# Patient Record
Sex: Female | Born: 1965 | Race: White | Hispanic: No | Smoking: Never smoker
Health system: Southern US, Community
[De-identification: ages and names within clinical notes are randomized; demographics above are authoritative.]

## PROBLEM LIST (undated history)

## (undated) DIAGNOSIS — F32A Depression, unspecified: Secondary | ICD-10-CM

## (undated) DIAGNOSIS — F329 Major depressive disorder, single episode, unspecified: Secondary | ICD-10-CM

## (undated) HISTORY — PX: WISDOM TOOTH EXTRACTION: SHX21

## (undated) HISTORY — PX: DILATION AND CURETTAGE OF UTERUS: SHX78

## (undated) HISTORY — PX: KNEE ARTHROSCOPY: SHX127

---

## 1999-04-26 ENCOUNTER — Other Ambulatory Visit: Admission: RE | Admit: 1999-04-26 | Discharge: 1999-04-26 | Payer: Self-pay | Admitting: Family Medicine

## 1999-09-15 ENCOUNTER — Encounter (INDEPENDENT_AMBULATORY_CARE_PROVIDER_SITE_OTHER): Payer: Self-pay | Admitting: Specialist

## 1999-09-15 ENCOUNTER — Other Ambulatory Visit: Admission: RE | Admit: 1999-09-15 | Discharge: 1999-09-15 | Payer: Self-pay | Admitting: Gynecology

## 1999-12-12 ENCOUNTER — Other Ambulatory Visit: Admission: RE | Admit: 1999-12-12 | Discharge: 1999-12-12 | Payer: Self-pay | Admitting: Gynecology

## 2000-08-23 ENCOUNTER — Other Ambulatory Visit: Admission: RE | Admit: 2000-08-23 | Discharge: 2000-08-23 | Payer: Self-pay | Admitting: Obstetrics and Gynecology

## 2000-09-20 ENCOUNTER — Ambulatory Visit (HOSPITAL_COMMUNITY): Admission: RE | Admit: 2000-09-20 | Discharge: 2000-09-20 | Payer: Self-pay | Admitting: Obstetrics and Gynecology

## 2000-09-20 ENCOUNTER — Encounter: Payer: Self-pay | Admitting: Obstetrics and Gynecology

## 2000-09-27 ENCOUNTER — Ambulatory Visit (HOSPITAL_COMMUNITY): Admission: RE | Admit: 2000-09-27 | Discharge: 2000-09-27 | Payer: Self-pay | Admitting: Obstetrics and Gynecology

## 2000-09-27 ENCOUNTER — Inpatient Hospital Stay (HOSPITAL_COMMUNITY): Admission: AD | Admit: 2000-09-27 | Discharge: 2000-09-27 | Payer: Self-pay | Admitting: Obstetrics and Gynecology

## 2000-09-27 ENCOUNTER — Encounter: Payer: Self-pay | Admitting: Obstetrics and Gynecology

## 2000-10-18 ENCOUNTER — Ambulatory Visit (HOSPITAL_COMMUNITY): Admission: RE | Admit: 2000-10-18 | Discharge: 2000-10-18 | Payer: Self-pay | Admitting: Obstetrics and Gynecology

## 2000-10-18 ENCOUNTER — Encounter: Payer: Self-pay | Admitting: Obstetrics and Gynecology

## 2000-12-20 ENCOUNTER — Inpatient Hospital Stay (HOSPITAL_COMMUNITY): Admission: AD | Admit: 2000-12-20 | Discharge: 2000-12-20 | Payer: Self-pay | Admitting: Obstetrics and Gynecology

## 2001-03-11 ENCOUNTER — Inpatient Hospital Stay (HOSPITAL_COMMUNITY): Admission: AD | Admit: 2001-03-11 | Discharge: 2001-03-13 | Payer: Self-pay | Admitting: Obstetrics and Gynecology

## 2001-03-14 ENCOUNTER — Encounter: Admission: RE | Admit: 2001-03-14 | Discharge: 2001-04-13 | Payer: Self-pay | Admitting: Obstetrics and Gynecology

## 2001-03-20 ENCOUNTER — Encounter: Payer: Self-pay | Admitting: Family Medicine

## 2001-03-20 ENCOUNTER — Encounter: Admission: RE | Admit: 2001-03-20 | Discharge: 2001-03-20 | Payer: Self-pay | Admitting: Family Medicine

## 2001-05-02 ENCOUNTER — Encounter: Payer: Self-pay | Admitting: Family Medicine

## 2001-05-02 ENCOUNTER — Encounter: Admission: RE | Admit: 2001-05-02 | Discharge: 2001-05-02 | Payer: Self-pay | Admitting: Family Medicine

## 2001-08-31 ENCOUNTER — Encounter: Admission: RE | Admit: 2001-08-31 | Discharge: 2001-08-31 | Payer: Self-pay | Admitting: Family Medicine

## 2001-08-31 ENCOUNTER — Encounter: Payer: Self-pay | Admitting: Family Medicine

## 2002-04-28 ENCOUNTER — Inpatient Hospital Stay (HOSPITAL_COMMUNITY): Admission: AD | Admit: 2002-04-28 | Discharge: 2002-04-28 | Payer: Self-pay | Admitting: Obstetrics and Gynecology

## 2002-05-29 ENCOUNTER — Other Ambulatory Visit: Admission: RE | Admit: 2002-05-29 | Discharge: 2002-05-29 | Payer: Self-pay | Admitting: Obstetrics and Gynecology

## 2002-07-09 ENCOUNTER — Inpatient Hospital Stay (HOSPITAL_COMMUNITY): Admission: AD | Admit: 2002-07-09 | Discharge: 2002-07-09 | Payer: Self-pay | Admitting: Obstetrics and Gynecology

## 2002-07-24 ENCOUNTER — Encounter: Payer: Self-pay | Admitting: Obstetrics and Gynecology

## 2002-07-24 ENCOUNTER — Ambulatory Visit (HOSPITAL_COMMUNITY): Admission: RE | Admit: 2002-07-24 | Discharge: 2002-07-24 | Payer: Self-pay | Admitting: Obstetrics and Gynecology

## 2002-10-10 ENCOUNTER — Inpatient Hospital Stay (HOSPITAL_COMMUNITY): Admission: AD | Admit: 2002-10-10 | Discharge: 2002-10-10 | Payer: Self-pay | Admitting: Obstetrics and Gynecology

## 2002-12-16 ENCOUNTER — Inpatient Hospital Stay (HOSPITAL_COMMUNITY): Admission: AD | Admit: 2002-12-16 | Discharge: 2002-12-18 | Payer: Self-pay | Admitting: Obstetrics and Gynecology

## 2003-07-08 ENCOUNTER — Other Ambulatory Visit: Admission: RE | Admit: 2003-07-08 | Discharge: 2003-07-08 | Payer: Self-pay | Admitting: Family Medicine

## 2004-07-12 ENCOUNTER — Other Ambulatory Visit: Admission: RE | Admit: 2004-07-12 | Discharge: 2004-07-12 | Payer: Self-pay | Admitting: Family Medicine

## 2005-07-20 ENCOUNTER — Other Ambulatory Visit: Admission: RE | Admit: 2005-07-20 | Discharge: 2005-07-20 | Payer: Self-pay | Admitting: Family Medicine

## 2006-08-22 ENCOUNTER — Other Ambulatory Visit: Admission: RE | Admit: 2006-08-22 | Discharge: 2006-08-22 | Payer: Self-pay | Admitting: Family Medicine

## 2007-09-03 ENCOUNTER — Other Ambulatory Visit: Admission: RE | Admit: 2007-09-03 | Discharge: 2007-09-03 | Payer: Self-pay | Admitting: Family Medicine

## 2008-09-15 ENCOUNTER — Other Ambulatory Visit: Admission: RE | Admit: 2008-09-15 | Discharge: 2008-09-15 | Payer: Self-pay | Admitting: Family Medicine

## 2009-11-03 ENCOUNTER — Other Ambulatory Visit: Admission: RE | Admit: 2009-11-03 | Discharge: 2009-11-03 | Payer: Self-pay | Admitting: Family Medicine

## 2010-09-09 NOTE — H&P (Signed)
[facility]  Patient:    Haley Melendez, Haley Melendez Visit Number: [id number] MRN: [id number]          Service Type: OBS Location: [id number] Attending Physician:  Leonard Schwartz Dictated by:   Saverio Danker, C.N.M. Admit Date:  03/11/2001                           History and Physical  HISTORY OF PRESENT ILLNESS:   The patient is a 45 year old married white female, gravida 5, para 0-0-4-0 at 40-1/7 weeks who presents complaining of uterine contractions every three to four minutes since about 4 a.m.  She denies any leaking or vaginal bleeding.  She denies any headache, nausea, vomiting, or visual disturbances.  She reports positive fetal movement.  Her pregnancy has been followed at [facility] by the certified nurse midwife service, and has been at risk for:  1. Advanced maternal age and normal amnio. 2. History of infertility. 3. 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 4. History of DES exposure. 5. Greater than two ABs.  Her group B strep is negative.  She desires an    epidural for this labor.  OB/GYN HISTORY:               She had elective ABs in 1988, 1989, and 1989, and a miscarriage in May of 2002, all without complications.  She had D&Cs with each procedure and received RhoGAM following each procedure.  She had an HSG in August of 2001, and required Clomid and tamoxifen for this conception. She reports that she was exposed to DES during early labor.  Her mom received DES when she was pregnant with her.  ALLERGIES:                    HYDROCODONE.  It give her itching, but she takes codeine without trouble.  PAST MEDICAL HISTORY:         She reports having the usual childhood diseases. She reports a history of mitral valve murmur, but no prolapse, and does not take antibiotics.  She reports occasional urinary tract infection.  PAST SURGICAL HISTORY:        Only surgery are wisdom teeth and toe nails.  FAMILY HISTORY:                Significant for paternal grandfather with MI. Multiple members with hypertension.  Maternal grandmother with COPD and adult onset diabetes.  Paternal grandmother with history of bladder cancer.  Uncle with CVA, now deceased.  GENETIC HISTORY:              Significant only for the fact that she is over age 1.  SOCIAL HISTORY:               She is married.  The father of the baby is Ed Solicitor.  He is involved and supportive.  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  They deny any illicit drug use, alcohol, or smoking with this pregnancy.  PRENATAL LABORATORY DATA:     Blood type is A-negative.  Her antibody screen is negative.  Syphilis is nonreactive.  Rubella is immune.  Hepatitis B surface antigen was negative.  ________ is immune.  GC and Chlamydia both negative.  Pap is within normal limits.  Her one hour glucola was elevated, but her three hour GPT was within normal limits.  PHYSICAL EXAMINATION:  VITAL SIGNS:  Initial blood pressure was 154/94, pulse is 118, respirations are 20, temperature is 97.1.  HEENT:                        Grossly within normal limits.  HEART:                        Regular rhythm and rate.  CHEST:                        Clear.  BREASTS:                      Soft and nontender.  ABDOMEN:                      Gravid with uterine contractions every two minutes.  Her fetal heart rate is reassuring with occasional quick variables.  CERVIX:                       Three to four centimeters, 100% vertex, -1, and bulging membranes.  EXTREMITIES:                  Within normal limits.  ASSESSMENT:                   1. Intrauterine pregnancy at term.                               2. Active labor and very uncomfortable.                               3. Desires epidural for labor.                               4. Elevated blood pressure on admission.  PLAN:                         To admit to labor and delivery,  to follow routine CNM orders, to place an epidural for labor as soon as possible.  To continue to check blood pressures and to obtain [facility] labs, and to notify Dr. Leonard Schwartz of the patients admission. Dictated by:   Vance Gather Duplantis, C.N.M. Attending Physician:  Leonard Schwartz DD:  03/11/01 TD:  03/11/01 Job: [id number]

## 2010-09-09 NOTE — Op Note (Signed)
[facility]  Patient:    Haley Melendez, Haley Melendez                        MRN: [id number] Proc. Date: 10/18/00 Adm. Date:  [id number] Disc. Date: [id number] Attending:  Dierdre Forth Pearline                           Operative Report  PREOPERATIVE DIAGNOSIS:       1. Intrauterine pregnancy at [redacted] weeks                                  gestation, maternal age 45.                               2. Abnormal alpha-fetoprotein.  POSTOPERATIVE DIAGNOSIS:      1. Intrauterine pregnancy at [redacted] weeks                                  gestation, maternal age 52.                               2. Abnormal alpha-fetoprotein.  OPERATION:                    Genetic amniocentesis.  SURGEON:                      Vanessa P. Pennie Rushing, M.D.  ANESTHESIA:                   Local.  ESTIMATED BLOOD LOSS:         Less than 5 cc.  COMPLICATIONS:                None.  FINDINGS:                     The patient is approximately [redacted] weeks gestation with a vertex presentation and a posterior placenta.  DESCRIPTION OF PROCEDURE:     The patient underwent a preliminary limited ultrasound scan to allow location of a fluid pocket.  This was located in the upper uterus slightly to the right of midline.  This area was marked and the abdominal area cleansed and a local anesthetic of 1% Xylocaine injected subcutaneously.  Under ultrasound guidance, a #20 gauge spinal needle was used to access the fluid pocket in a single stick.  The first 5 cc of fluid were removed in a syringe and then the syringe changed to remove another 13 cc. The post-amniocentesis heart rate was in the 140s.  No immediate complications of the amniocentesis were noted.  The amnio site was documented and the needle removed.  The patient tolerated the procedure well.  The patient is Rh negative and had received RhoGAM approximately two weeks ago, so no further RhoGAM was required.  The fluid was sent for analysis to [facility]. DD:  10/18/00 TD:  10/18/00 Job: [id number]

## 2010-09-09 NOTE — H&P (Signed)
NAME:  Haley Melendez, CERVANTEZ NO.:  [id number]   MEDICAL RECORD NO.:  [id number]                   PATIENT TYPE:  INP   LOCATION:  9173                                 FACILITY:  [facility]   PHYSICIAN:  Osborn Coho, M.D.                DATE OF BIRTH:  10/07/1965   DATE OF ADMISSION:  12/16/2002  DATE OF DISCHARGE:                                HISTORY & PHYSICAL   HISTORY OF PRESENT ILLNESS:  This is a 45 year old gravida 6, para 1-0-4-1,  at 39-0/7 weeks who presents with complaints of regular uterine contractions  since 6 a.m.  She denies leaking or bleeding and reports positive fetal  movement.  Pregnancy has been followed by the certified nurse midwife and  remarkable for:  1) Greater than two abortions.  2) AMA.  3) Rh negative.  4) Questionable DES exposure.  5) Father of the baby is a PKU carrier.  6)  The patient is an M.D.  7) Group B Strep negative.   PAST OBSTETRICAL HISTORY:  Remarkable for elective abortions in 1988, 1989,  and 1989.  Spontaneous abortion in 2001.  Vaginal delivery in 2002 of a  female infant at [redacted] weeks gestation weighing 8 pounds 0 ounces remarkable  for meconium stained fluid, otherwise uncomplicated.   PAST MEDICAL HISTORY:  Remarkable for a history of infertility and Clomid  use on the first pregnancy.  History of  questionable DES exposure, Rh  negative.  Mitral valve heart murmur without any evidence of prolapse.  Postcoital UTI's.   PAST SURGICAL HISTORY:  Remarkable for wisdom teeth extraction and toenail  surgery.   FAMILY HISTORY:  Remarkable for grandfather with MI. Grandmother, uncle,  father, and brother with hypertension.  Grandmother with bladder cancer,  uncle with stroke.  Mother with COPD secondary to smoking.  Mother with  adult onset diabetes mellitus.  Genetic history is remarkable for the father  of the baby being a carrier for PKU.   SOCIAL HISTORY:  The patient is married to [employer] who is involved  and  supportive.  She is of the [employer].  She works as a Development worker, community.  She  denies any alcohol, tobacco, or drug use.   PRENATAL LABORATORY DATA:  Hemoglobin 12.6, platelets 306, blood type A  negative, antibody screen positive for RhoGAM antibodies.  RPR nonreactive,  rubella immune, HBSAG negative, HIV declined, Pap test normal, gonorrhea  negative, and Chlamydia negative. Glucose Challenge within normal limits.  Group B Strep negative.   PHYSICAL EXAMINATION:  VITAL SIGNS: Stable.  Afebrile.  HEENT:  Within normal limits.  Thyroid normal and enlarged.  CHEST:  Clear to auscultation.  HEART:  Regular rate and rhythm.  ABDOMEN:  Gravid at 39 cm. Vertex to Leopold's. EFM shows reactive fetal  heart rate with positive accelerations and average variability.  Uterine  contractions every 1-1/2 to 2 minutes.  PELVIC:  Cervix is 5 cm, 90% effaced, and -1 station with a vertex  presentation and bulging membranes.  EXTREMITIES: Within normal limits.   ASSESSMENT:  1. Intrauterine pregnancy at 39-0/7 weeks.  2. Active labor.   PLAN:  1. Admit to birthing suite, Dr. Su Hilt notified.  2. Routine C.N.M. orders.  3. Anticipate SVD.     Marie L. Williams, C.N.M.                 Osborn Coho, M.D.    MLW/MEDQ  D:  12/16/2002  T:  12/16/2002  Job:  [id number]

## 2010-09-09 NOTE — Op Note (Signed)
[facility]  Patient:    Haley Melendez, Haley Melendez                        MRN: [id number] Adm. Date:  [id number] Attending:  Shaune Spittle                           Operative Report  DATE OF BIRTH:                30-Nov-1965  PROCEDURE:                    Genetic amniocentesis.  PREOPERATIVE DIAGNOSIS:       Maternal age of 27.  POSTOPERATIVE DIAGNOSES:      1. Maternal age of 44.                               2. Inability to obtain amniocentesis.  SURGEON:                      Vanessa P. Pennie Rushing, M.D.  ANESTHESIA:                   Local.  ESTIMATED BLOOD LOSS:         Less than 10 cc.  COMPLICATIONS:                Inability to access amniotic fluid sac.  FINDINGS:                     The gestational age was consistent with the BPD and was 17-weeks gestation.  Amniotic fluid was normal.  The placenta was posterior.  DESCRIPTION OF PROCEDURE:     The patient had undergone a limited ultrasound to identify a fluid pocket and the above-noted findings.  Preamniocentesis heart rate was 150 beats per minute.  After careful evaluation, a pocket in the suprapubic region just to the left of midline was identified and marked on the skin.  The skin was cleansed with Betadine and infiltrated with 1% Xylocaine.  A 20-gauge Ultraview needle was then placed through that site to try and access the amniotic fluid pocket.  No amniotic fluid egress was noted. Visualization of the needle under ultrasound seemed to indicate that the needle was in the amniotic fluid pocket.  However, no fluid could be obtained. The needle was manipulated on several occasions within the pocket area under ultrasound guidance without access to amniotic fluid.  At that time, the patient declined to have further manipulation, and the needle was removed. The postamniocentesis attempt heart rate was 145 beats per minute.  The patient then came to the [facility] office  for AFP and hCG evaluation.  The patient is Rh negative and does need RhoGAM.  She tolerated the procedure reasonably well. DD:  09/27/00 TD:  09/28/00 Job: [id number]

## 2011-01-06 ENCOUNTER — Other Ambulatory Visit: Payer: Self-pay | Admitting: Family Medicine

## 2011-01-06 ENCOUNTER — Other Ambulatory Visit (HOSPITAL_COMMUNITY)
Admission: RE | Admit: 2011-01-06 | Discharge: 2011-01-06 | Disposition: A | Discharge status: Home / Self Care | Payer: PRIVATE HEALTH INSURANCE | Source: Ambulatory Visit | Attending: Family Medicine | Admitting: Family Medicine

## 2011-01-06 DIAGNOSIS — Z124 Encounter for screening for malignant neoplasm of cervix: Secondary | ICD-10-CM | POA: Insufficient documentation

## 2012-01-12 ENCOUNTER — Other Ambulatory Visit (HOSPITAL_COMMUNITY)
Admission: RE | Admit: 2012-01-12 | Discharge: 2012-01-12 | Disposition: A | Discharge status: Home / Self Care | Payer: PRIVATE HEALTH INSURANCE | Source: Ambulatory Visit | Attending: Family Medicine | Admitting: Family Medicine

## 2012-01-12 ENCOUNTER — Other Ambulatory Visit: Payer: Self-pay | Admitting: Family Medicine

## 2012-01-12 DIAGNOSIS — Z124 Encounter for screening for malignant neoplasm of cervix: Secondary | ICD-10-CM | POA: Insufficient documentation

## 2013-01-30 ENCOUNTER — Other Ambulatory Visit (HOSPITAL_COMMUNITY)
Admission: RE | Admit: 2013-01-30 | Discharge: 2013-01-30 | Disposition: A | Discharge status: Home / Self Care | Payer: PRIVATE HEALTH INSURANCE | Source: Ambulatory Visit | Attending: Family Medicine | Admitting: Family Medicine

## 2013-01-30 ENCOUNTER — Other Ambulatory Visit: Payer: Self-pay

## 2013-01-30 DIAGNOSIS — Z1151 Encounter for screening for human papillomavirus (HPV): Secondary | ICD-10-CM | POA: Insufficient documentation

## 2013-01-30 DIAGNOSIS — Z01419 Encounter for gynecological examination (general) (routine) without abnormal findings: Secondary | ICD-10-CM | POA: Insufficient documentation

## 2016-03-30 DIAGNOSIS — Z1231 Encounter for screening mammogram for malignant neoplasm of breast: Secondary | ICD-10-CM | POA: Diagnosis not present

## 2016-04-04 ENCOUNTER — Other Ambulatory Visit: Payer: Self-pay | Admitting: Family Medicine

## 2016-04-04 DIAGNOSIS — R928 Other abnormal and inconclusive findings on diagnostic imaging of breast: Secondary | ICD-10-CM

## 2016-04-07 DIAGNOSIS — H40013 Open angle with borderline findings, low risk, bilateral: Secondary | ICD-10-CM | POA: Diagnosis not present

## 2016-04-07 DIAGNOSIS — H524 Presbyopia: Secondary | ICD-10-CM | POA: Diagnosis not present

## 2016-04-14 ENCOUNTER — Other Ambulatory Visit: Payer: Self-pay | Admitting: Family Medicine

## 2016-04-14 ENCOUNTER — Ambulatory Visit
Admission: RE | Admit: 2016-04-14 | Discharge: 2016-04-14 | Disposition: A | Discharge status: Home / Self Care | Payer: PRIVATE HEALTH INSURANCE | Source: Ambulatory Visit | Attending: Family Medicine | Admitting: Family Medicine

## 2016-04-14 DIAGNOSIS — R921 Mammographic calcification found on diagnostic imaging of breast: Secondary | ICD-10-CM

## 2016-04-14 DIAGNOSIS — R928 Other abnormal and inconclusive findings on diagnostic imaging of breast: Secondary | ICD-10-CM

## 2016-04-24 HISTORY — PX: BREAST LUMPECTOMY: SHX2

## 2016-04-28 ENCOUNTER — Ambulatory Visit
Admission: RE | Admit: 2016-04-28 | Discharge: 2016-04-28 | Disposition: A | Discharge status: Home / Self Care | Payer: BLUE CROSS/BLUE SHIELD | Source: Ambulatory Visit | Attending: Family Medicine | Admitting: Family Medicine

## 2016-04-28 DIAGNOSIS — R921 Mammographic calcification found on diagnostic imaging of breast: Secondary | ICD-10-CM

## 2016-04-28 DIAGNOSIS — N6012 Diffuse cystic mastopathy of left breast: Secondary | ICD-10-CM | POA: Diagnosis not present

## 2016-05-19 ENCOUNTER — Ambulatory Visit: Payer: Self-pay | Admitting: General Surgery

## 2016-05-19 DIAGNOSIS — N6489 Other specified disorders of breast: Secondary | ICD-10-CM | POA: Diagnosis not present

## 2016-05-25 ENCOUNTER — Ambulatory Visit: Payer: Self-pay | Admitting: General Surgery

## 2016-05-25 ENCOUNTER — Other Ambulatory Visit: Payer: Self-pay | Admitting: General Surgery

## 2016-05-25 DIAGNOSIS — N6489 Other specified disorders of breast: Secondary | ICD-10-CM

## 2016-06-09 ENCOUNTER — Encounter (HOSPITAL_BASED_OUTPATIENT_CLINIC_OR_DEPARTMENT_OTHER): Payer: Self-pay | Admitting: *Deleted

## 2016-06-15 ENCOUNTER — Ambulatory Visit
Admission: RE | Admit: 2016-06-15 | Discharge: 2016-06-15 | Disposition: A | Discharge status: Home / Self Care | Payer: BLUE CROSS/BLUE SHIELD | Source: Ambulatory Visit | Attending: General Surgery | Admitting: General Surgery

## 2016-06-15 DIAGNOSIS — R928 Other abnormal and inconclusive findings on diagnostic imaging of breast: Secondary | ICD-10-CM | POA: Diagnosis not present

## 2016-06-15 NOTE — Progress Notes (Signed)
Boost drink given with instructions to complete by 0515, pt verbalized understanding.

## 2016-06-15 NOTE — H&P (Signed)
History of Present Illness Haley Melendez T. Carletha Dawn MD; 05/19/2016 1:47 PM) The patient is a 51 year old female who presents with a complaint of Breast problems. Patient is referred by Dr. Rance Muir for a recent abnormal mammogram and large core needle biopsy revealing a complex sclerosing lesion. Haley Melendez denies any significant previous breast history. She has had some symptoms suggestive of fibrocystic disease but no previous biopsies or interventions required. She has had routine annual mammograms since age 79. No family history of breast cancer. She recently presented for her routine screening mammogram. This revealed a new small area of calcifications spanning 4 mm at the 12 o'clock position of the left breast just above the nipple. It was initially felt to be some distortion but this resolved with compression. This was a new finding and large core needle biopsy was recommended and performed. This has revealed a complex sclerosing lesion with fibrocystic changes and calcifications. With this finding she was referred for consideration for excisional biopsy. She has had a little breast discomfort but no lump or nipple discharge or bleeding or crusting.   Past Surgical History Dorris Fetch, CMA; 05/19/2016 1:41 PM) Breast Biopsy  Left. Knee Surgery  Left. Oral Surgery   Diagnostic Studies History Dorris Fetch, CMA; 05/19/2016 1:41 PM) Colonoscopy  1-5 years ago Mammogram  within last year Pap Smear  1-5 years ago  Allergies Dorris Fetch, CMA; 05/19/2016 1:41 PM) Hydrocodone-Acetaminophen *ANALGESICS - OPIOID*  Itching.  Medication History Dorris Fetch, CMA; 05/19/2016 1:42 PM) TraZODone HCl (50MG  Tablet, Oral) Active. Citalopram Hydrobromide (20MG  Tablet, Oral) Active. Glucosamine 1500 Complex (Oral) Active. Melatonin (5MG  Tablet, Oral) Active. Womens Multivitamin (Oral) Active. Medications Reconciled  Social History Dorris Fetch,  CMA; 05/19/2016 1:41 PM) Alcohol use  Remotely quit alcohol use. Caffeine use  Coffee. No drug use  Tobacco use  Never smoker.  Family History Dorris Fetch, CMA; 05/19/2016 1:41 PM) Arthritis  Father, Mother. Depression  Brother. Diabetes Mellitus  Brother. Heart Disease  Brother, Father, Mother. Heart disease in female family member before age 33  Hypertension  Brother, Father, Mother. Migraine Headache  Daughter, Haley Melendez. Seizure disorder  Son.  Pregnancy / Birth History Dorris Fetch, CMA; 05/19/2016 1:41 PM) Age at menarche  11 years. Contraceptive History  Oral contraceptives. Gravida  4 Irregular periods  Length (months) of breastfeeding  7-12 Maternal age  46-25 Para  2  Other Problems Dorris Fetch, CMA; 05/19/2016 1:41 PM) Anxiety Disorder  Back Pain  Diverticulosis  Heart murmur  Migraine Headache     Review of Systems Dorris Fetch CMA; 05/19/2016 1:41 PM) General Not Present- Appetite Loss, Chills, Fatigue, Fever, Night Sweats, Weight Gain and Weight Loss. Skin Not Present- Change in Wart/Mole, Dryness, Hives, Jaundice, New Lesions, Non-Healing Wounds, Rash and Ulcer. HEENT Not Present- Earache, Hearing Loss, Hoarseness, Nose Bleed, Oral Ulcers, Ringing in the Ears, Seasonal Allergies, Sinus Pain, Sore Throat, Visual Disturbances, Wears glasses/contact lenses and Yellow Eyes. Respiratory Present- Snoring. Not Present- Bloody sputum, Chronic Cough, Difficulty Breathing and Wheezing. Breast Not Present- Breast Mass, Breast Pain, Nipple Discharge and Skin Changes. Cardiovascular Not Present- Chest Pain, Difficulty Breathing Lying Down, Leg Cramps, Palpitations, Rapid Heart Rate, Shortness of Breath and Swelling of Extremities. Gastrointestinal Not Present- Abdominal Pain, Bloating, Bloody Stool, Change in Bowel Habits, Chronic diarrhea, Constipation, Difficulty Swallowing, Excessive gas, Gets full quickly at meals,  Hemorrhoids, Indigestion, Nausea, Rectal Pain and Vomiting. Female Genitourinary Not Present- Frequency, Nocturia, Painful Urination, Pelvic Pain and Urgency. Musculoskeletal Present- Back Pain. Not Present- Joint Pain, Joint  Stiffness, Muscle Pain, Muscle Weakness and Swelling of Extremities. Neurological Not Present- Decreased Memory, Fainting, Headaches, Numbness, Seizures, Tingling, Tremor, Trouble walking and Weakness. Psychiatric Not Present- Anxiety, Bipolar, Change in Sleep Pattern, Depression, Fearful and Frequent crying. Endocrine Present- Hot flashes. Not Present- Cold Intolerance, Excessive Hunger, Hair Changes, Heat Intolerance and New Diabetes. Hematology Not Present- Blood Thinners, Easy Bruising, Excessive bleeding, Gland problems, HIV and Persistent Infections.  Vitals Dorris Fetch(Emily Schmitz CMA; 05/19/2016 1:43 PM) 05/19/2016 1:43 PM Weight: 152.4 lb Height: 65.5in Height was reported by patient. Body Surface Area: 1.77 m Body Mass Index: 24.97 kg/m  Temp.: 98.65F  Pulse: 73 (Regular)  BP: 130/82 (Sitting, Left Arm, Standard)       Physical Exam Haley Melendez(Sharrieff Spratlin T. Khamari Sheehan MD; 05/19/2016 1:55 PM) The physical exam findings are as follows: Note:General: Alert, well-developed and well nourished Caucasian female, in no distress Skin: Warm and dry without rash or infection. HEENT: No palpable masses or thyromegaly. Sclera nonicteric. Pupils equal round and reactive. Lymph nodes: No cervical, supraclavicular, nodes palpable. Breasts: No palpable masses in either breast. Possibly some thickening upper left breast consistent with fibrocystic change. No nipple discharge or crusting or inversion Lungs: Breath sounds clear and equal. No wheezing or increased work of breathing. Cardiovascular: Regular rate and rhythm without murmer. No JVD or edema. Extremities: No edema or joint swelling or deformity. No chronic venous stasis changes. Neurologic: Alert and fully  oriented. Gait normal. No focal weakness. Psychiatric: Normal mood and affect. Thought content appropriate with normal judgement and insight    Assessment & Plan Haley Melendez(Laker Thompson T. Sakiya Stepka MD; 05/19/2016 2:06 PM) RADIAL SCAR OF BREAST (Z61.09(N64.89) Impression: 51 year old female with new area of microcalcifications in the left breast is large core needle biopsy showing complex sclerosing lesion. I discussed the findings with the patient and her significant other. I discussed options of" follow-up versus surgical excision. We discussed that there is a small, approximately 10% chance of underlying malignancy. After discussion she would like to proceed with excisional biopsy. We discussed the nature of the surgery and recovery and risks of anesthetic complications, bleeding and infection. All her questions were answered. Current Plans Radioactive seed localized left breast lumpectomy under general anesthesia as an outpatient

## 2016-06-16 ENCOUNTER — Encounter (HOSPITAL_BASED_OUTPATIENT_CLINIC_OR_DEPARTMENT_OTHER)
Admission: RE | Disposition: A | Discharge status: Home / Self Care | Payer: Self-pay | Source: Ambulatory Visit | Attending: General Surgery

## 2016-06-16 ENCOUNTER — Ambulatory Visit (HOSPITAL_BASED_OUTPATIENT_CLINIC_OR_DEPARTMENT_OTHER)
Admission: RE | Admit: 2016-06-16 | Discharge: 2016-06-16 | Disposition: A | Discharge status: Home / Self Care | Payer: BLUE CROSS/BLUE SHIELD | Source: Ambulatory Visit | Attending: General Surgery | Admitting: General Surgery

## 2016-06-16 ENCOUNTER — Encounter (HOSPITAL_BASED_OUTPATIENT_CLINIC_OR_DEPARTMENT_OTHER): Payer: Self-pay | Admitting: Certified Registered"

## 2016-06-16 ENCOUNTER — Ambulatory Visit (HOSPITAL_BASED_OUTPATIENT_CLINIC_OR_DEPARTMENT_OTHER): Payer: BLUE CROSS/BLUE SHIELD | Admitting: Certified Registered"

## 2016-06-16 ENCOUNTER — Ambulatory Visit
Admission: RE | Admit: 2016-06-16 | Discharge: 2016-06-16 | Disposition: A | Discharge status: Home / Self Care | Payer: BLUE CROSS/BLUE SHIELD | Source: Ambulatory Visit | Attending: General Surgery | Admitting: General Surgery

## 2016-06-16 DIAGNOSIS — N6489 Other specified disorders of breast: Secondary | ICD-10-CM | POA: Diagnosis not present

## 2016-06-16 DIAGNOSIS — F419 Anxiety disorder, unspecified: Secondary | ICD-10-CM | POA: Insufficient documentation

## 2016-06-16 DIAGNOSIS — N6022 Fibroadenosis of left breast: Secondary | ICD-10-CM | POA: Diagnosis not present

## 2016-06-16 DIAGNOSIS — Z8249 Family history of ischemic heart disease and other diseases of the circulatory system: Secondary | ICD-10-CM | POA: Diagnosis not present

## 2016-06-16 DIAGNOSIS — Z820 Family history of epilepsy and other diseases of the nervous system: Secondary | ICD-10-CM | POA: Insufficient documentation

## 2016-06-16 DIAGNOSIS — M549 Dorsalgia, unspecified: Secondary | ICD-10-CM | POA: Insufficient documentation

## 2016-06-16 DIAGNOSIS — Z8261 Family history of arthritis: Secondary | ICD-10-CM | POA: Insufficient documentation

## 2016-06-16 DIAGNOSIS — Z885 Allergy status to narcotic agent status: Secondary | ICD-10-CM | POA: Diagnosis not present

## 2016-06-16 DIAGNOSIS — R011 Cardiac murmur, unspecified: Secondary | ICD-10-CM | POA: Diagnosis not present

## 2016-06-16 DIAGNOSIS — Z818 Family history of other mental and behavioral disorders: Secondary | ICD-10-CM | POA: Diagnosis not present

## 2016-06-16 DIAGNOSIS — Z833 Family history of diabetes mellitus: Secondary | ICD-10-CM | POA: Diagnosis not present

## 2016-06-16 DIAGNOSIS — R928 Other abnormal and inconclusive findings on diagnostic imaging of breast: Secondary | ICD-10-CM | POA: Diagnosis not present

## 2016-06-16 DIAGNOSIS — N6012 Diffuse cystic mastopathy of left breast: Secondary | ICD-10-CM | POA: Diagnosis not present

## 2016-06-16 DIAGNOSIS — K5790 Diverticulosis of intestine, part unspecified, without perforation or abscess without bleeding: Secondary | ICD-10-CM | POA: Insufficient documentation

## 2016-06-16 DIAGNOSIS — R921 Mammographic calcification found on diagnostic imaging of breast: Secondary | ICD-10-CM | POA: Diagnosis not present

## 2016-06-16 DIAGNOSIS — G43909 Migraine, unspecified, not intractable, without status migrainosus: Secondary | ICD-10-CM | POA: Diagnosis not present

## 2016-06-16 HISTORY — PX: BREAST LUMPECTOMY WITH RADIOACTIVE SEED LOCALIZATION: SHX6424

## 2016-06-16 HISTORY — DX: Major depressive disorder, single episode, unspecified: F32.9

## 2016-06-16 HISTORY — DX: Depression, unspecified: F32.A

## 2016-06-16 SURGERY — BREAST LUMPECTOMY WITH RADIOACTIVE SEED LOCALIZATION
Anesthesia: General | Site: Breast | Laterality: Left

## 2016-06-16 MED ORDER — ONDANSETRON HCL 4 MG/2ML IJ SOLN
INTRAMUSCULAR | Status: AC
  Filled 2016-06-16: qty 2

## 2016-06-16 MED ORDER — LIDOCAINE 2% (20 MG/ML) 5 ML SYRINGE
INTRAMUSCULAR | Status: AC
  Filled 2016-06-16: qty 5

## 2016-06-16 MED ORDER — MEPERIDINE HCL 25 MG/ML IJ SOLN
6.2500 mg | INTRAMUSCULAR | Status: DC | PRN
Start: 2016-06-16 — End: 2016-06-16

## 2016-06-16 MED ORDER — BUPIVACAINE-EPINEPHRINE 0.5% -1:200000 IJ SOLN
INTRAMUSCULAR | Status: DC | PRN
  Administered 2016-06-16: 30 mL

## 2016-06-16 MED ORDER — MIDAZOLAM HCL 2 MG/2ML IJ SOLN
INTRAMUSCULAR | Status: AC
  Filled 2016-06-16: qty 2

## 2016-06-16 MED ORDER — BUPIVACAINE HCL (PF) 0.25 % IJ SOLN
INTRAMUSCULAR | Status: AC
  Filled 2016-06-16: qty 30

## 2016-06-16 MED ORDER — CEFAZOLIN SODIUM-DEXTROSE 2-4 GM/100ML-% IV SOLN
INTRAVENOUS | Status: AC
  Filled 2016-06-16: qty 100

## 2016-06-16 MED ORDER — FENTANYL CITRATE (PF) 100 MCG/2ML IJ SOLN
25.0000 ug | INTRAMUSCULAR | Status: DC | PRN
  Administered 2016-06-16 (×2): 25 ug via INTRAVENOUS

## 2016-06-16 MED ORDER — PROPOFOL 500 MG/50ML IV EMUL
INTRAVENOUS | Status: AC
  Filled 2016-06-16: qty 50

## 2016-06-16 MED ORDER — LACTATED RINGERS IV SOLN: INTRAVENOUS | Status: DC

## 2016-06-16 MED ORDER — GABAPENTIN 300 MG PO CAPS
ORAL_CAPSULE | ORAL | Status: AC
  Filled 2016-06-16: qty 1

## 2016-06-16 MED ORDER — SCOPOLAMINE 1 MG/3DAYS TD PT72: 1.0000 | MEDICATED_PATCH | Freq: Once | TRANSDERMAL | Status: DC | PRN

## 2016-06-16 MED ORDER — LACTATED RINGERS IV SOLN
INTRAVENOUS | Status: DC
  Administered 2016-06-16 (×2): via INTRAVENOUS

## 2016-06-16 MED ORDER — GABAPENTIN 300 MG PO CAPS
300.0000 mg | ORAL_CAPSULE | ORAL | Status: AC
  Administered 2016-06-16: 300 mg via ORAL

## 2016-06-16 MED ORDER — CELECOXIB 400 MG PO CAPS
400.0000 mg | ORAL_CAPSULE | ORAL | Status: AC
  Administered 2016-06-16: 400 mg via ORAL

## 2016-06-16 MED ORDER — CHLORHEXIDINE GLUCONATE CLOTH 2 % EX PADS: 6.0000 | MEDICATED_PAD | Freq: Once | CUTANEOUS | Status: DC

## 2016-06-16 MED ORDER — FENTANYL CITRATE (PF) 100 MCG/2ML IJ SOLN
INTRAMUSCULAR | Status: AC
  Filled 2016-06-16: qty 2

## 2016-06-16 MED ORDER — BUPIVACAINE-EPINEPHRINE (PF) 0.5% -1:200000 IJ SOLN
INTRAMUSCULAR | Status: AC
  Filled 2016-06-16: qty 30

## 2016-06-16 MED ORDER — ACETAMINOPHEN 500 MG PO TABS
1000.0000 mg | ORAL_TABLET | ORAL | Status: AC
  Administered 2016-06-16: 1000 mg via ORAL

## 2016-06-16 MED ORDER — DEXAMETHASONE SODIUM PHOSPHATE 4 MG/ML IJ SOLN
INTRAMUSCULAR | Status: DC | PRN
  Administered 2016-06-16: 10 mg via INTRAVENOUS

## 2016-06-16 MED ORDER — ONDANSETRON HCL 4 MG/2ML IJ SOLN
INTRAMUSCULAR | Status: DC | PRN
  Administered 2016-06-16: 4 mg via INTRAVENOUS

## 2016-06-16 MED ORDER — DEXAMETHASONE SODIUM PHOSPHATE 10 MG/ML IJ SOLN
INTRAMUSCULAR | Status: AC
  Filled 2016-06-16: qty 1

## 2016-06-16 MED ORDER — ACETAMINOPHEN 500 MG PO TABS
ORAL_TABLET | ORAL | Status: AC
  Filled 2016-06-16: qty 2

## 2016-06-16 MED ORDER — LIDOCAINE HCL (CARDIAC) 20 MG/ML IV SOLN
INTRAVENOUS | Status: DC | PRN
  Administered 2016-06-16: 60 mg via INTRAVENOUS

## 2016-06-16 MED ORDER — FENTANYL CITRATE (PF) 100 MCG/2ML IJ SOLN
50.0000 ug | INTRAMUSCULAR | Status: DC | PRN
  Administered 2016-06-16: 50 ug via INTRAVENOUS
  Administered 2016-06-16: 25 ug via INTRAVENOUS

## 2016-06-16 MED ORDER — METOCLOPRAMIDE HCL 5 MG/ML IJ SOLN: 10.0000 mg | Freq: Once | INTRAMUSCULAR | Status: DC | PRN

## 2016-06-16 MED ORDER — CEFAZOLIN SODIUM-DEXTROSE 2-4 GM/100ML-% IV SOLN
2.0000 g | INTRAVENOUS | Status: AC
  Administered 2016-06-16: 2 g via INTRAVENOUS

## 2016-06-16 MED ORDER — MIDAZOLAM HCL 2 MG/2ML IJ SOLN
1.0000 mg | INTRAMUSCULAR | Status: DC | PRN
  Administered 2016-06-16: 2 mg via INTRAVENOUS

## 2016-06-16 MED ORDER — CELECOXIB 200 MG PO CAPS
ORAL_CAPSULE | ORAL | Status: AC
  Filled 2016-06-16: qty 2

## 2016-06-16 MED ORDER — PROPOFOL 10 MG/ML IV BOLUS
INTRAVENOUS | Status: DC | PRN
  Administered 2016-06-16: 150 mg via INTRAVENOUS

## 2016-06-16 SURGICAL SUPPLY — 50 items
BINDER BREAST LRG (GAUZE/BANDAGES/DRESSINGS) ×2 IMPLANT
BINDER BREAST MEDIUM (GAUZE/BANDAGES/DRESSINGS) IMPLANT
BINDER BREAST XLRG (GAUZE/BANDAGES/DRESSINGS) IMPLANT
BINDER BREAST XXLRG (GAUZE/BANDAGES/DRESSINGS) IMPLANT
BLADE SURG 15 STRL LF DISP TIS (BLADE) ×1 IMPLANT
BLADE SURG 15 STRL SS (BLADE) ×1
CANISTER SUC SOCK COL 7IN (MISCELLANEOUS) IMPLANT
CANISTER SUCT 1200ML W/VALVE (MISCELLANEOUS) IMPLANT
CHLORAPREP W/TINT 26ML (MISCELLANEOUS) ×2 IMPLANT
CLIP TI WIDE RED SMALL 6 (CLIP) ×2 IMPLANT
COVER BACK TABLE 60X90IN (DRAPES) ×2 IMPLANT
COVER MAYO STAND STRL (DRAPES) ×2 IMPLANT
COVER PROBE W GEL 5X96 (DRAPES) ×2 IMPLANT
DECANTER SPIKE VIAL GLASS SM (MISCELLANEOUS) IMPLANT
DERMABOND ADVANCED (GAUZE/BANDAGES/DRESSINGS) ×1
DERMABOND ADVANCED .7 DNX12 (GAUZE/BANDAGES/DRESSINGS) ×1 IMPLANT
DEVICE DUBIN W/COMP PLATE 8390 (MISCELLANEOUS) ×2 IMPLANT
DRAPE LAPAROSCOPIC ABDOMINAL (DRAPES) ×2 IMPLANT
DRAPE UTILITY XL STRL (DRAPES) ×2 IMPLANT
DRSG PAD ABDOMINAL 8X10 ST (GAUZE/BANDAGES/DRESSINGS) ×2 IMPLANT
ELECT COATED BLADE 2.86 ST (ELECTRODE) ×2 IMPLANT
ELECT REM PT RETURN 9FT ADLT (ELECTROSURGICAL) ×2
ELECTRODE REM PT RTRN 9FT ADLT (ELECTROSURGICAL) ×1 IMPLANT
GLOVE BIO SURGEON STRL SZ 6.5 (GLOVE) ×2 IMPLANT
GLOVE BIOGEL PI IND STRL 7.0 (GLOVE) ×2 IMPLANT
GLOVE BIOGEL PI IND STRL 8 (GLOVE) ×1 IMPLANT
GLOVE BIOGEL PI INDICATOR 7.0 (GLOVE) ×2
GLOVE BIOGEL PI INDICATOR 8 (GLOVE) ×1
GLOVE ECLIPSE 7.5 STRL STRAW (GLOVE) ×2 IMPLANT
GLOVE EXAM NITRILE MD LF STRL (GLOVE) ×2 IMPLANT
GLOVE SURG SS PI 6.5 STRL IVOR (GLOVE) ×2 IMPLANT
GOWN STRL REUS W/ TWL LRG LVL3 (GOWN DISPOSABLE) ×1 IMPLANT
GOWN STRL REUS W/ TWL XL LVL3 (GOWN DISPOSABLE) ×1 IMPLANT
GOWN STRL REUS W/TWL LRG LVL3 (GOWN DISPOSABLE) ×1
GOWN STRL REUS W/TWL XL LVL3 (GOWN DISPOSABLE) ×1
ILLUMINATOR WAVEGUIDE N/F (MISCELLANEOUS) IMPLANT
KIT MARKER MARGIN INK (KITS) ×2 IMPLANT
NEEDLE HYPO 25X1 1.5 SAFETY (NEEDLE) ×2 IMPLANT
NS IRRIG 1000ML POUR BTL (IV SOLUTION) ×2 IMPLANT
PACK BASIN DAY SURGERY FS (CUSTOM PROCEDURE TRAY) ×2 IMPLANT
PENCIL BUTTON HOLSTER BLD 10FT (ELECTRODE) ×2 IMPLANT
SLEEVE SCD COMPRESS KNEE MED (MISCELLANEOUS) ×2 IMPLANT
SPONGE LAP 4X18 X RAY DECT (DISPOSABLE) ×2 IMPLANT
SUT MON AB 5-0 PS2 18 (SUTURE) ×2 IMPLANT
SUT VICRYL 3-0 CR8 SH (SUTURE) ×2 IMPLANT
SYR CONTROL 10ML LL (SYRINGE) ×2 IMPLANT
TOWEL OR 17X24 6PK STRL BLUE (TOWEL DISPOSABLE) ×2 IMPLANT
TOWEL OR NON WOVEN STRL DISP B (DISPOSABLE) IMPLANT
TUBE CONNECTING 20X1/4 (TUBING) IMPLANT
YANKAUER SUCT BULB TIP NO VENT (SUCTIONS) IMPLANT

## 2016-06-16 NOTE — Discharge Instructions (Signed)
[facility] °Office Phone Number [phone] ° °BREAST BIOPSY/ PARTIAL MASTECTOMY: POST OP INSTRUCTIONS ° °Always review your discharge instruction sheet given to you by the facility where your surgery was performed. ° °IF YOU HAVE DISABILITY OR FAMILY LEAVE FORMS, YOU MUST BRING THEM TO THE OFFICE FOR PROCESSING.  DO NOT GIVE THEM TO YOUR DOCTOR. ° °1. A prescription for pain medication may be given to you upon discharge.  Take your pain medication as prescribed, if needed.  If narcotic pain medicine is not needed, then you may take acetaminophen (Tylenol) or ibuprofen (Advil) as needed. °2. Take your usually prescribed medications unless otherwise directed °3. If you need a refill on your pain medication, please contact your pharmacy.  They will contact our office to request authorization.  Prescriptions will not be filled after 5pm or on week-ends. °4. You should eat very light the first 24 hours after surgery, such as soup, crackers, pudding, etc.  Resume your normal diet the day after surgery. °5. Most patients will experience some swelling and bruising in the breast.  Ice packs and a good support bra will help.  Swelling and bruising can take several days to resolve.  °6. It is common to experience some constipation if taking pain medication after surgery.  Increasing fluid intake and taking a stool softener will usually help or prevent this problem from occurring.  A mild laxative (Milk of Magnesia or Miralax) should be taken according to package directions if there are no bowel movements after 48 hours. °7. Unless discharge instructions indicate otherwise, you may remove your bandages 24-48 hours after surgery, and you may shower at that time.  You may have steri-strips (small skin tapes) in place directly over the incision.  These strips should be left on the skin for 7-10 days.  If your surgeon used skin glue on the incision, you may shower in 24 hours.  The glue will flake off over the  next 2-3 weeks.  Any sutures or staples will be removed at the office during your follow-up visit. °8. ACTIVITIES:  You may resume regular daily activities (gradually increasing) beginning the next day.  Wearing a good support bra or sports bra minimizes pain and swelling.  You may have sexual intercourse when it is comfortable. °a. You may drive when you no longer are taking prescription pain medication, you can comfortably wear a seatbelt, and you can safely maneuver your car and apply brakes. °b. RETURN TO WORK:  ______________________________________________________________________________________ °9. You should see your doctor in the office for a follow-up appointment approximately two weeks after your surgery.  Your doctor’s nurse will typically make your follow-up appointment when she calls you with your pathology report.  Expect your pathology report 2-3 business days after your surgery.  You may call to check if you do not hear from us after three days. °10. OTHER INSTRUCTIONS: _______________________________________________________________________________________________ _____________________________________________________________________________________________________________________________________ °_____________________________________________________________________________________________________________________________________ °_____________________________________________________________________________________________________________________________________ ° °WHEN TO CALL YOUR DOCTOR: °1. Fever over 101.0 °2. Nausea and/or vomiting. °3. Extreme swelling or bruising. °4. Continued bleeding from incision. °5. Increased pain, redness, or drainage from the incision. ° °The clinic staff is available to answer your questions during regular business hours.  Please don’t hesitate to call and ask to speak to one of the nurses for clinical concerns.  If you have a medical emergency, go to the nearest  emergency room or call 911.  A surgeon from [facility] is always on call at the hospital. ° °For further questions, please visit [email or web address]  ° ° ° °  Post Anesthesia Home Care Instructions ° °Activity: °Get plenty of rest for the remainder of the day. A responsible adult should stay with you for 24 hours following the procedure.  °For the next 24 hours, DO NOT: °-Drive a car °-Operate machinery °-Drink alcoholic beverages °-Take any medication unless instructed by your physician °-Make any legal decisions or sign important papers. ° °Meals: °Start with liquid foods such as gelatin or soup. Progress to regular foods as tolerated. Avoid greasy, spicy, heavy foods. If nausea and/or vomiting occur, drink only clear liquids until the nausea and/or vomiting subsides. Call your physician if vomiting continues. ° °Special Instructions/Symptoms: °Your throat may feel dry or sore from the anesthesia or the breathing tube placed in your throat during surgery. If this causes discomfort, gargle with warm salt water. The discomfort should disappear within 24 hours. ° °If you had a scopolamine patch placed behind your ear for the management of post- operative nausea and/or vomiting: ° °1. The medication in the patch is effective for 72 hours, after which it should be removed.  Wrap patch in a tissue and discard in the trash. Wash hands thoroughly with soap and water. °2. You may remove the patch earlier than 72 hours if you experience unpleasant side effects which may include dry mouth, dizziness or visual disturbances. °3. Avoid touching the patch. Wash your hands with soap and water after contact with the patch. °  ° °

## 2016-06-16 NOTE — Op Note (Signed)
Preoperative Diagnosis: complex sclerosing lesion left breast  Postoprative Diagnosis: complex sclerosing lesion left breast  Procedure: Procedure(s): LEFT BREAST LUMPECTOMY WITH RADIOACTIVE SEED LOCALIZATION   Surgeon: Glenna FellowsHoxworth, Ariyanna Oien T   Assistants: None  Anesthesia:  General LMA anesthesia  Indications: Patient is a 51 year old female with a recent screening mammogram showing an abnormal density in the left breast centrally and large core biopsy showing a complex sclerosing lesion. After discussion of options detailed elsewhere we have elected to proceed with excision.    Procedure Detail:  Patient had previously undergone accurate placement of a radioactive seed at the site of the clip and lesion. See placement was confirmed in the holding area. She was brought to the operating room, placed in the supine position on the operating table and laryngeal mask general anesthesia induced. The left breast was widely sterilely prepped and draped. She received preoperative IV antibiotics. PAS port in place. Patient timeout was performed and correct procedure verified. The neoprobe was used to localize the seed in the central left breast 12:00 position. A curvilinear incision was made at the superior areolar border and dissection carried down through the subcutaneous tissue. Using the neoprobe for guidance the dissection was deepened down into the breast tissue. As the seed was approached using the neoprobe for guidance I excised an approximately 2-1/2 cm specimen of breast tissue around the seed. The tissue in this area was very firm without a discrete mass. The tissue was inked for margins. Specimen x-ray showed the seed and marking clip centrally located within the specimen. This was sent for permanent pathology. The cavity was marked with a clip. Complete hemostasis was obtained. The soft tissue was extensively infiltrated with Marcaine. The breast and subcutaneous Tissue was closed with interrupted  3-0 Vicryl and the skin with subcuticular 5-0 Monocryl and Dermabond. Sponge needle and instrument counts were correct.    Findings: As above  Estimated Blood Loss:  Minimal         Drains: None  Blood Given: none          Specimens: Left breast tissue        Complications:  * No complications entered in OR log *         Disposition: PACU - hemodynamically stable.         Condition: stable

## 2016-06-16 NOTE — Interval H&P Note (Signed)
History and Physical Interval Note:  06/16/2016 8:38 AM  Haley Melendez  has presented today for surgery, with the diagnosis of complex sclerosing lesion left breast  The various methods of treatment have been discussed with the patient and family. After consideration of risks, benefits and other options for treatment, the patient has consented to  Procedure(s): LEFT BREAST LUMPECTOMY WITH RADIOACTIVE SEED LOCALIZATION (Left) as a surgical intervention .  The patient's history has been reviewed, patient examined, no change in status, stable for surgery.  I have reviewed the patient's chart and labs.  Questions were answered to the patient's satisfaction.     [name]

## 2016-06-16 NOTE — Anesthesia Preprocedure Evaluation (Addendum)
Anesthesia Evaluation  Patient identified by MRN, date of birth, ID band Patient awake    Reviewed: Allergy & Precautions, NPO status , Patient's Chart, lab work & pertinent test results  Airway Mallampati: II  TM Distance: >3 FB Neck ROM: Full    Dental no notable dental hx.    Pulmonary neg pulmonary ROS,    Pulmonary exam normal breath sounds clear to auscultation       Cardiovascular negative cardio ROS Normal cardiovascular exam Rhythm:Regular Rate:Normal     Neuro/Psych Depression negative neurological ROS  negative psych ROS   GI/Hepatic negative GI ROS, Neg liver ROS,   Endo/Other  negative endocrine ROS  Renal/GU negative Renal ROS  negative genitourinary   Musculoskeletal negative musculoskeletal ROS (+)   Abdominal   Peds negative pediatric ROS (+)  Hematology negative hematology ROS (+)   Anesthesia Other Findings   Reproductive/Obstetrics negative OB ROS                            Anesthesia Physical Anesthesia Plan  ASA: II  Anesthesia Plan: General   Post-op Pain Management:    Induction: Intravenous  Airway Management Planned: LMA  Additional Equipment:   Intra-op Plan:   Post-operative Plan: Extubation in OR  Informed Consent: I have reviewed the patients History and Physical, chart, labs and discussed the procedure including the risks, benefits and alternatives for the proposed anesthesia with the patient or authorized representative who has indicated his/her understanding and acceptance.   Dental advisory given  Plan Discussed with: CRNA  Anesthesia Plan Comments:         Anesthesia Quick Evaluation

## 2016-06-16 NOTE — Transfer of Care (Signed)
Immediate Anesthesia Transfer of Care Note  Patient: Haley Melendez  Procedure(s) Performed: Procedure(s): LEFT BREAST LUMPECTOMY WITH RADIOACTIVE SEED LOCALIZATION (Left)  Patient Location: PACU  Anesthesia Type:General  Level of Consciousness: awake, alert , oriented and patient cooperative  Airway & Oxygen Therapy: Patient Spontanous Breathing and Patient connected to face mask oxygen  Post-op Assessment: Report given to RN and Post -op Vital signs reviewed and stable  Post vital signs: Reviewed and stable  Last Vitals:  Vitals:   06/16/16 0813  BP: 125/84  Pulse: 63  Resp: 18  Temp: 36.7 C    Last Pain:  Vitals:   06/16/16 0813  TempSrc: Oral      Patients Stated Pain Goal: 0 (06/16/16 0813)  Complications: No apparent anesthesia complications

## 2016-06-16 NOTE — Anesthesia Postprocedure Evaluation (Signed)
Anesthesia Post Note  Patient: Haley Melendez  Procedure(s) Performed: Procedure(s) (LRB): LEFT BREAST LUMPECTOMY WITH RADIOACTIVE SEED LOCALIZATION (Left)  Patient location during evaluation: PACU Anesthesia Type: General Level of consciousness: awake and alert Pain management: pain level controlled Vital Signs Assessment: post-procedure vital signs reviewed and stable Respiratory status: spontaneous breathing, nonlabored ventilation, respiratory function stable and patient connected to nasal cannula oxygen Cardiovascular status: blood pressure returned to baseline and stable Postop Assessment: no signs of nausea or vomiting Anesthetic complications: no       Last Vitals:  Vitals:   06/16/16 1045 06/16/16 1118  BP: 132/81 (!) 153/90  Pulse: 79 79  Resp: 12 18  Temp:  36.6 C    Last Pain:  Vitals:   06/16/16 1118  TempSrc: Oral  PainSc: 0-No pain                 Phillips Groutarignan, Jaquarius Seder

## 2016-06-16 NOTE — Anesthesia Procedure Notes (Signed)
Procedure Name: LMA Insertion Date/Time: 06/16/2016 8:58 AM Performed by: Nicolo Tomko D Pre-anesthesia Checklist: Patient identified, Emergency Drugs available, Suction available and Patient being monitored Patient Re-evaluated:Patient Re-evaluated prior to inductionOxygen Delivery Method: Circle system utilized Preoxygenation: Pre-oxygenation with 100% oxygen Intubation Type: IV induction Ventilation: Mask ventilation without difficulty LMA: LMA inserted LMA Size: 4.0 Number of attempts: 1 Airway Equipment and Method: Bite block Placement Confirmation: positive ETCO2 Tube secured with: Tape Dental Injury: Teeth and Oropharynx as per pre-operative assessment

## 2016-06-19 ENCOUNTER — Encounter (HOSPITAL_BASED_OUTPATIENT_CLINIC_OR_DEPARTMENT_OTHER): Payer: Self-pay | Admitting: General Surgery

## 2017-03-23 DIAGNOSIS — Z0000 Encounter for general adult medical examination without abnormal findings: Secondary | ICD-10-CM | POA: Diagnosis not present

## 2017-03-23 DIAGNOSIS — E785 Hyperlipidemia, unspecified: Secondary | ICD-10-CM | POA: Diagnosis not present

## 2017-04-06 ENCOUNTER — Other Ambulatory Visit: Payer: Self-pay | Admitting: Family Medicine

## 2017-04-06 DIAGNOSIS — Z1231 Encounter for screening mammogram for malignant neoplasm of breast: Secondary | ICD-10-CM

## 2017-05-11 ENCOUNTER — Ambulatory Visit
Admission: RE | Admit: 2017-05-11 | Discharge: 2017-05-11 | Disposition: A | Discharge status: Home / Self Care | Payer: BLUE CROSS/BLUE SHIELD | Source: Ambulatory Visit | Attending: Family Medicine | Admitting: Family Medicine

## 2017-05-11 DIAGNOSIS — Z1231 Encounter for screening mammogram for malignant neoplasm of breast: Secondary | ICD-10-CM | POA: Diagnosis not present

## 2018-04-05 ENCOUNTER — Other Ambulatory Visit: Payer: Self-pay | Admitting: Family Medicine

## 2018-04-05 ENCOUNTER — Other Ambulatory Visit (HOSPITAL_COMMUNITY)
Admission: RE | Admit: 2018-04-05 | Discharge: 2018-04-05 | Disposition: A | Discharge status: Home / Self Care | Payer: BLUE CROSS/BLUE SHIELD | Source: Ambulatory Visit | Attending: Family Medicine | Admitting: Family Medicine

## 2018-04-05 DIAGNOSIS — Z0000 Encounter for general adult medical examination without abnormal findings: Secondary | ICD-10-CM | POA: Diagnosis not present

## 2018-04-05 DIAGNOSIS — Z01411 Encounter for gynecological examination (general) (routine) with abnormal findings: Secondary | ICD-10-CM | POA: Insufficient documentation

## 2018-04-09 ENCOUNTER — Other Ambulatory Visit: Payer: Self-pay | Admitting: Family Medicine

## 2018-04-09 DIAGNOSIS — Z1231 Encounter for screening mammogram for malignant neoplasm of breast: Secondary | ICD-10-CM

## 2018-04-09 LAB — CYTOLOGY - PAP
Diagnosis: NEGATIVE
HPV: NOT DETECTED

## 2018-05-17 ENCOUNTER — Ambulatory Visit
Admission: RE | Admit: 2018-05-17 | Discharge: 2018-05-17 | Disposition: A | Discharge status: Home / Self Care | Payer: BLUE CROSS/BLUE SHIELD | Source: Ambulatory Visit | Attending: Family Medicine | Admitting: Family Medicine

## 2018-05-17 DIAGNOSIS — Z1231 Encounter for screening mammogram for malignant neoplasm of breast: Secondary | ICD-10-CM | POA: Diagnosis not present

## 2018-08-29 DIAGNOSIS — M5137 Other intervertebral disc degeneration, lumbosacral region: Secondary | ICD-10-CM | POA: Diagnosis not present

## 2018-08-29 DIAGNOSIS — M48061 Spinal stenosis, lumbar region without neurogenic claudication: Secondary | ICD-10-CM | POA: Diagnosis not present

## 2018-08-29 DIAGNOSIS — M5412 Radiculopathy, cervical region: Secondary | ICD-10-CM | POA: Diagnosis not present

## 2018-09-03 DIAGNOSIS — M545 Low back pain: Secondary | ICD-10-CM | POA: Diagnosis not present

## 2018-09-03 DIAGNOSIS — M5412 Radiculopathy, cervical region: Secondary | ICD-10-CM | POA: Diagnosis not present

## 2018-09-03 DIAGNOSIS — M48061 Spinal stenosis, lumbar region without neurogenic claudication: Secondary | ICD-10-CM | POA: Diagnosis not present

## 2018-09-03 DIAGNOSIS — M542 Cervicalgia: Secondary | ICD-10-CM | POA: Diagnosis not present

## 2018-09-05 DIAGNOSIS — M5412 Radiculopathy, cervical region: Secondary | ICD-10-CM | POA: Diagnosis not present

## 2018-09-05 DIAGNOSIS — M48061 Spinal stenosis, lumbar region without neurogenic claudication: Secondary | ICD-10-CM | POA: Diagnosis not present

## 2018-09-05 DIAGNOSIS — M545 Low back pain: Secondary | ICD-10-CM | POA: Diagnosis not present

## 2018-09-05 DIAGNOSIS — M542 Cervicalgia: Secondary | ICD-10-CM | POA: Diagnosis not present

## 2018-09-10 DIAGNOSIS — M48061 Spinal stenosis, lumbar region without neurogenic claudication: Secondary | ICD-10-CM | POA: Diagnosis not present

## 2018-09-10 DIAGNOSIS — M5412 Radiculopathy, cervical region: Secondary | ICD-10-CM | POA: Diagnosis not present

## 2018-09-10 DIAGNOSIS — M542 Cervicalgia: Secondary | ICD-10-CM | POA: Diagnosis not present

## 2018-09-10 DIAGNOSIS — M545 Low back pain: Secondary | ICD-10-CM | POA: Diagnosis not present

## 2018-09-12 DIAGNOSIS — M545 Low back pain: Secondary | ICD-10-CM | POA: Diagnosis not present

## 2018-09-12 DIAGNOSIS — M5412 Radiculopathy, cervical region: Secondary | ICD-10-CM | POA: Diagnosis not present

## 2018-09-12 DIAGNOSIS — M542 Cervicalgia: Secondary | ICD-10-CM | POA: Diagnosis not present

## 2018-09-12 DIAGNOSIS — M48061 Spinal stenosis, lumbar region without neurogenic claudication: Secondary | ICD-10-CM | POA: Diagnosis not present

## 2018-09-18 DIAGNOSIS — M48061 Spinal stenosis, lumbar region without neurogenic claudication: Secondary | ICD-10-CM | POA: Diagnosis not present

## 2018-09-18 DIAGNOSIS — M545 Low back pain: Secondary | ICD-10-CM | POA: Diagnosis not present

## 2018-09-18 DIAGNOSIS — M542 Cervicalgia: Secondary | ICD-10-CM | POA: Diagnosis not present

## 2018-09-18 DIAGNOSIS — M5412 Radiculopathy, cervical region: Secondary | ICD-10-CM | POA: Diagnosis not present

## 2018-09-24 DIAGNOSIS — M5412 Radiculopathy, cervical region: Secondary | ICD-10-CM | POA: Diagnosis not present

## 2018-09-24 DIAGNOSIS — M542 Cervicalgia: Secondary | ICD-10-CM | POA: Diagnosis not present

## 2018-09-24 DIAGNOSIS — M545 Low back pain: Secondary | ICD-10-CM | POA: Diagnosis not present

## 2018-09-24 DIAGNOSIS — M48061 Spinal stenosis, lumbar region without neurogenic claudication: Secondary | ICD-10-CM | POA: Diagnosis not present

## 2018-09-30 DIAGNOSIS — M5412 Radiculopathy, cervical region: Secondary | ICD-10-CM | POA: Diagnosis not present

## 2018-09-30 DIAGNOSIS — M542 Cervicalgia: Secondary | ICD-10-CM | POA: Diagnosis not present

## 2018-09-30 DIAGNOSIS — M545 Low back pain: Secondary | ICD-10-CM | POA: Diagnosis not present

## 2018-09-30 DIAGNOSIS — M48061 Spinal stenosis, lumbar region without neurogenic claudication: Secondary | ICD-10-CM | POA: Diagnosis not present

## 2018-10-08 DIAGNOSIS — M48061 Spinal stenosis, lumbar region without neurogenic claudication: Secondary | ICD-10-CM | POA: Diagnosis not present

## 2018-10-08 DIAGNOSIS — M545 Low back pain: Secondary | ICD-10-CM | POA: Diagnosis not present

## 2018-10-08 DIAGNOSIS — M5412 Radiculopathy, cervical region: Secondary | ICD-10-CM | POA: Diagnosis not present

## 2018-10-08 DIAGNOSIS — M542 Cervicalgia: Secondary | ICD-10-CM | POA: Diagnosis not present

## 2018-10-15 DIAGNOSIS — M545 Low back pain: Secondary | ICD-10-CM | POA: Diagnosis not present

## 2018-10-15 DIAGNOSIS — M48061 Spinal stenosis, lumbar region without neurogenic claudication: Secondary | ICD-10-CM | POA: Diagnosis not present

## 2018-10-15 DIAGNOSIS — M5412 Radiculopathy, cervical region: Secondary | ICD-10-CM | POA: Diagnosis not present

## 2018-10-15 DIAGNOSIS — M542 Cervicalgia: Secondary | ICD-10-CM | POA: Diagnosis not present

## 2018-10-29 DIAGNOSIS — M48061 Spinal stenosis, lumbar region without neurogenic claudication: Secondary | ICD-10-CM | POA: Diagnosis not present

## 2018-10-29 DIAGNOSIS — M542 Cervicalgia: Secondary | ICD-10-CM | POA: Diagnosis not present

## 2018-10-29 DIAGNOSIS — M545 Low back pain: Secondary | ICD-10-CM | POA: Diagnosis not present

## 2018-10-29 DIAGNOSIS — M5412 Radiculopathy, cervical region: Secondary | ICD-10-CM | POA: Diagnosis not present

## 2018-11-12 ENCOUNTER — Other Ambulatory Visit: Payer: Self-pay | Admitting: Neurosurgery

## 2018-11-12 DIAGNOSIS — M5137 Other intervertebral disc degeneration, lumbosacral region: Secondary | ICD-10-CM

## 2018-11-15 ENCOUNTER — Ambulatory Visit
Admission: RE | Admit: 2018-11-15 | Discharge: 2018-11-15 | Disposition: A | Discharge status: Home / Self Care | Payer: BC Managed Care – PPO | Source: Ambulatory Visit | Attending: Neurosurgery | Admitting: Neurosurgery

## 2018-11-15 ENCOUNTER — Other Ambulatory Visit: Payer: Self-pay

## 2018-11-15 DIAGNOSIS — M48061 Spinal stenosis, lumbar region without neurogenic claudication: Secondary | ICD-10-CM | POA: Diagnosis not present

## 2018-11-15 DIAGNOSIS — M5137 Other intervertebral disc degeneration, lumbosacral region: Secondary | ICD-10-CM

## 2018-11-28 ENCOUNTER — Other Ambulatory Visit: Payer: Self-pay | Admitting: Neurosurgery

## 2018-11-28 DIAGNOSIS — M5440 Lumbago with sciatica, unspecified side: Secondary | ICD-10-CM

## 2018-11-29 ENCOUNTER — Other Ambulatory Visit: Payer: Self-pay

## 2018-11-29 ENCOUNTER — Ambulatory Visit
Admission: RE | Admit: 2018-11-29 | Discharge: 2018-11-29 | Disposition: A | Discharge status: Home / Self Care | Payer: BC Managed Care – PPO | Source: Ambulatory Visit | Attending: Neurosurgery | Admitting: Neurosurgery

## 2018-11-29 DIAGNOSIS — M5126 Other intervertebral disc displacement, lumbar region: Secondary | ICD-10-CM | POA: Diagnosis not present

## 2018-11-29 DIAGNOSIS — M5440 Lumbago with sciatica, unspecified side: Secondary | ICD-10-CM

## 2018-11-29 MED ORDER — METHYLPREDNISOLONE ACETATE 40 MG/ML INJ SUSP (RADIOLOG
120.0000 mg | Freq: Once | INTRAMUSCULAR | Status: AC
  Administered 2018-11-29: 15:00:00 120 mg via EPIDURAL

## 2018-11-29 MED ORDER — IOPAMIDOL (ISOVUE-M 200) INJECTION 41%
1.0000 mL | Freq: Once | INTRAMUSCULAR | Status: AC
  Administered 2018-11-29: 1 mL via EPIDURAL

## 2018-11-29 NOTE — Discharge Instructions (Signed)

## 2018-12-01 ENCOUNTER — Other Ambulatory Visit: Payer: BLUE CROSS/BLUE SHIELD

## 2018-12-03 DIAGNOSIS — M5126 Other intervertebral disc displacement, lumbar region: Secondary | ICD-10-CM | POA: Diagnosis not present

## 2018-12-03 DIAGNOSIS — M48061 Spinal stenosis, lumbar region without neurogenic claudication: Secondary | ICD-10-CM | POA: Diagnosis not present

## 2018-12-19 DIAGNOSIS — F329 Major depressive disorder, single episode, unspecified: Secondary | ICD-10-CM | POA: Diagnosis not present

## 2018-12-19 DIAGNOSIS — M5430 Sciatica, unspecified side: Secondary | ICD-10-CM | POA: Diagnosis not present

## 2019-03-11 ENCOUNTER — Other Ambulatory Visit: Payer: Self-pay | Admitting: Neurosurgery

## 2019-03-11 DIAGNOSIS — M5440 Lumbago with sciatica, unspecified side: Secondary | ICD-10-CM

## 2019-03-19 ENCOUNTER — Other Ambulatory Visit: Payer: Self-pay

## 2019-03-19 ENCOUNTER — Ambulatory Visit
Admission: RE | Admit: 2019-03-19 | Discharge: 2019-03-19 | Disposition: A | Discharge status: Home / Self Care | Payer: Commercial Managed Care - PPO | Source: Ambulatory Visit | Attending: Neurosurgery | Admitting: Neurosurgery

## 2019-03-19 DIAGNOSIS — M5440 Lumbago with sciatica, unspecified side: Secondary | ICD-10-CM

## 2019-03-19 MED ORDER — METHYLPREDNISOLONE ACETATE 40 MG/ML INJ SUSP (RADIOLOG
120.0000 mg | Freq: Once | INTRAMUSCULAR | Status: AC
  Administered 2019-03-19: 14:00:00 120 mg via EPIDURAL

## 2019-03-19 MED ORDER — IOPAMIDOL (ISOVUE-M 200) INJECTION 41%
1.0000 mL | Freq: Once | INTRAMUSCULAR | Status: AC
  Administered 2019-03-19: 1 mL via EPIDURAL

## 2019-03-19 NOTE — Discharge Instructions (Signed)

## 2019-06-26 ENCOUNTER — Other Ambulatory Visit: Payer: Self-pay | Admitting: Neurosurgery

## 2019-06-26 DIAGNOSIS — M5440 Lumbago with sciatica, unspecified side: Secondary | ICD-10-CM

## 2019-07-11 ENCOUNTER — Other Ambulatory Visit: Payer: Self-pay

## 2019-07-11 ENCOUNTER — Ambulatory Visit
Admission: RE | Admit: 2019-07-11 | Discharge: 2019-07-11 | Disposition: A | Discharge status: Home / Self Care | Payer: Commercial Managed Care - PPO | Source: Ambulatory Visit | Attending: Neurosurgery | Admitting: Neurosurgery

## 2019-07-11 DIAGNOSIS — M5440 Lumbago with sciatica, unspecified side: Secondary | ICD-10-CM

## 2019-07-11 MED ORDER — IOPAMIDOL (ISOVUE-M 200) INJECTION 41%
1.0000 mL | Freq: Once | INTRAMUSCULAR | Status: AC
  Administered 2019-07-11: 14:00:00 1 mL via EPIDURAL

## 2019-07-11 MED ORDER — METHYLPREDNISOLONE ACETATE 40 MG/ML INJ SUSP (RADIOLOG
120.0000 mg | Freq: Once | INTRAMUSCULAR | Status: AC
  Administered 2019-07-11: 120 mg via EPIDURAL

## 2019-07-11 NOTE — Discharge Instructions (Signed)

## 2021-06-11 IMAGING — XA Imaging study
1 series · 1 of 1 positions shown · non-contrast
Comparison: none

CLINICAL DATA: Lumbago of the lumbar region with sciatica.

[Series 1: ortho adipose · 1 of 1 slices shown]
[im 1/1]
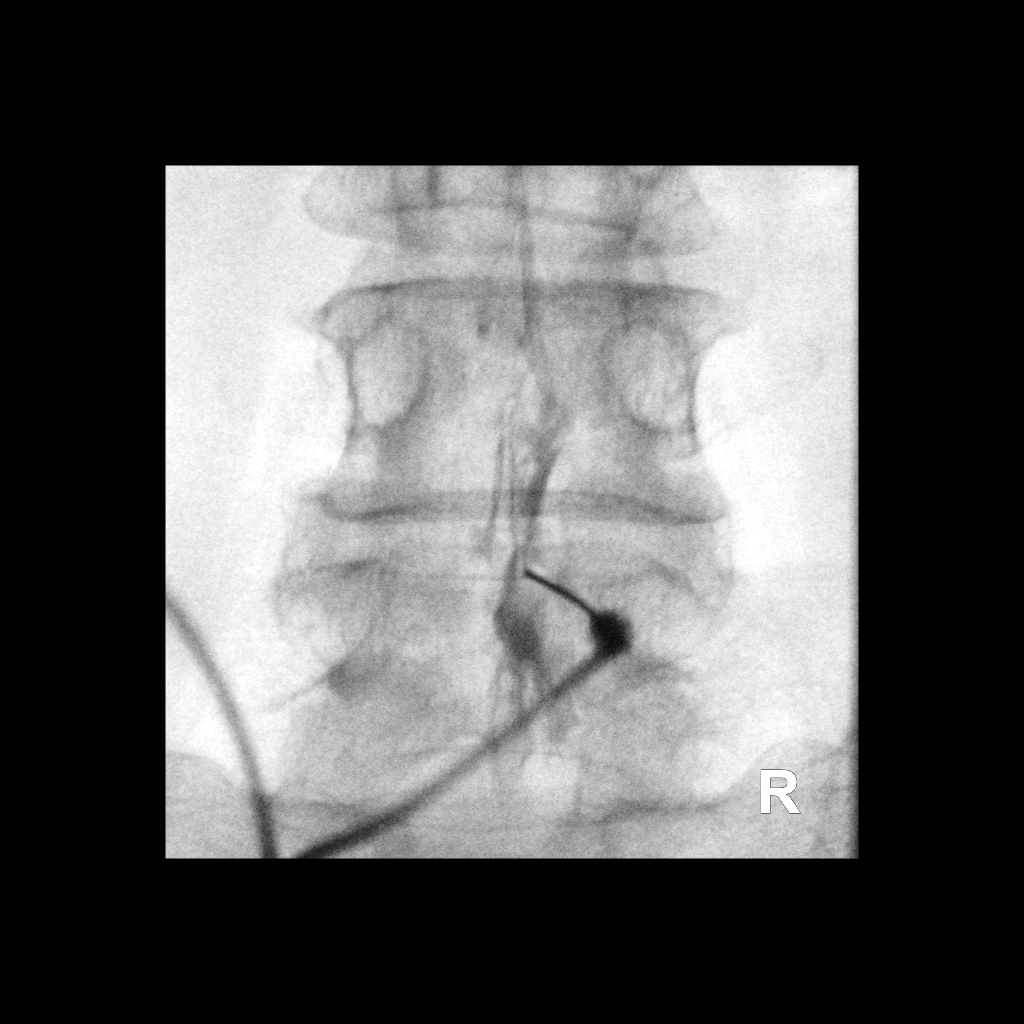

[1 of 1 positions shown; findings below may reference images not displayed]

FLUOROSCOPY TIME:  Radiation Exposure Index (as provided by the
fluoroscopic device): 10.42 uGy*m2

PROCEDURE:
The procedure, risks, benefits, and alternatives were explained to
the patient. Questions regarding the procedure were encouraged and
answered. The patient understands and consents to the procedure.

LUMBAR EPIDURAL INJECTION:

An interlaminar approach was performed on right at L4-5. The
overlying skin was cleansed and anesthetized. A 20 gauge epidural
needle was advanced using loss-of-resistance technique.

DIAGNOSTIC EPIDURAL INJECTION:

Injection of Isovue-M 200 shows a good epidural pattern with spread
above and below the level of needle placement, primarily on the
right no vascular opacification is seen.

THERAPEUTIC EPIDURAL INJECTION:

120 mg of Depo-Medrol mixed with 3 mL 1% lidocaine were instilled.
The procedure was well-tolerated, and the patient was discharged
thirty minutes following the injection in good condition.

COMPLICATIONS:
None
IMPRESSION: Technically successful epidural injection on the right L4-5 # 1

## 2022-05-25 DIAGNOSIS — C4491 Basal cell carcinoma of skin, unspecified: Secondary | ICD-10-CM

## 2022-05-25 HISTORY — DX: Basal cell carcinoma of skin, unspecified: C44.91

## 2023-03-01 ENCOUNTER — Ambulatory Visit: Payer: Commercial Managed Care - PPO | Admitting: Dermatology

## 2023-03-01 ENCOUNTER — Encounter: Payer: Self-pay | Admitting: Dermatology

## 2023-03-01 DIAGNOSIS — Z808 Family history of malignant neoplasm of other organs or systems: Secondary | ICD-10-CM

## 2023-03-01 DIAGNOSIS — L814 Other melanin hyperpigmentation: Secondary | ICD-10-CM

## 2023-03-01 DIAGNOSIS — L821 Other seborrheic keratosis: Secondary | ICD-10-CM

## 2023-03-01 DIAGNOSIS — Z1283 Encounter for screening for malignant neoplasm of skin: Secondary | ICD-10-CM

## 2023-03-01 DIAGNOSIS — L578 Other skin changes due to chronic exposure to nonionizing radiation: Secondary | ICD-10-CM

## 2023-03-01 DIAGNOSIS — L570 Actinic keratosis: Secondary | ICD-10-CM

## 2023-03-01 DIAGNOSIS — Z85828 Personal history of other malignant neoplasm of skin: Secondary | ICD-10-CM

## 2023-03-01 DIAGNOSIS — I781 Nevus, non-neoplastic: Secondary | ICD-10-CM

## 2023-03-01 DIAGNOSIS — D229 Melanocytic nevi, unspecified: Secondary | ICD-10-CM

## 2023-03-01 DIAGNOSIS — D1801 Hemangioma of skin and subcutaneous tissue: Secondary | ICD-10-CM

## 2023-03-01 DIAGNOSIS — W908XXA Exposure to other nonionizing radiation, initial encounter: Secondary | ICD-10-CM

## 2023-03-01 NOTE — Patient Instructions (Signed)

## 2023-03-01 NOTE — Progress Notes (Signed)
New Patient Visit   Subjective  Haley Melendez is a 57 y.o. female who presents for the following: originally made appointment for a spot at nose but had it biopsied somewhere else while waiting for this appointment. Results showed BCC, patient had treated with Mohs in [place]. Patient does have some other areas at nose and arms to check. Patient will schedule a TBSE.   The patient has spots, moles and lesions to be evaluated, some may be new or changing and the patient may have concern these could be cancer.   The following portions of the chart were reviewed this encounter and updated as appropriate: medications, allergies, medical history  Review of Systems:  No other skin or systemic complaints except as noted in HPI or Assessment and Plan.  Objective  Well appearing patient in no apparent distress; mood and affect are within normal limits.   A focused examination was performed of the following areas: Arms, face, back  Relevant exam findings are noted in the Assessment and Plan.  Nose Erythematous thin papules/macules with gritty scale.   Nose Dilated blood vessel     Assessment & Plan   HISTORY OF BASAL CELL CARCINOMA OF THE SKIN - No evidence of recurrence today at nose, Feb 2024, Mohs in [place] - Recommend regular full body skin exams - Recommend daily broad spectrum sunscreen SPF 30+ to sun-exposed areas, reapply every 2 hours as needed.  - Call if any new or changing lesions are noted between office visits  FAMILY HISTORY OF SKIN CANCER What type(s): BCC Who affected: brothers  AK (actinic keratosis) Nose  Actinic keratoses are precancerous spots that appear secondary to cumulative UV radiation exposure/sun exposure over time. They are chronic with expected duration over 1 year. A portion of actinic keratoses will progress to squamous cell carcinoma of the skin. It is not possible to reliably predict which spots will progress to skin cancer and so treatment  is recommended to prevent development of skin cancer.  Recommend daily broad spectrum sunscreen SPF 30+ to sun-exposed areas, reapply every 2 hours as needed.  Recommend staying in the shade or wearing long sleeves, sun glasses (UVA+UVB protection) and wide brim hats (4-inch brim around the entire circumference of the hat). Call for new or changing lesions.   Destruction of lesion - Nose  Destruction method: cryotherapy   Informed consent: discussed and consent obtained   Lesion destroyed using liquid nitrogen: Yes   Cryotherapy cycles:  2 Outcome: patient tolerated procedure well with no complications   Post-procedure details: wound care instructions given    Telangiectasia Nose  Benign-appearing.  Observation.  Call clinic for new or changing lesions.  Recommend daily use of broad spectrum spf 30+ sunscreen to sun-exposed areas.  Counseling for BBL / IPL / Laser and Coordination of Care Discussed the treatment option of Broad Band Light (BBL) /Intense Pulsed Light (IPL)/ Laser for skin discoloration, including brown spots and redness.  Typically we recommend at least 1-3 treatment sessions about 5-8 weeks apart for best results.  Cannot have tanned skin when BBL performed, and regular use of sunscreen/photoprotection is advised after the procedure to help maintain results. The patient's condition may also require "maintenance treatments" in the future.  The fee for BBL / laser treatments is $350 per treatment session for the whole face.  A fee can be quoted for other parts of the body.  Insurance typically does not pay for BBL/laser treatments and therefore the fee is an out-of-pocket cost. Recommend  prophylactic valtrex treatment. Once scheduled for procedure, will send Rx in prior to patient's appointment.   ACTINIC DAMAGE - chronic, secondary to cumulative UV radiation exposure/sun exposure over time - diffuse scaly erythematous macules with underlying dyspigmentation - Recommend daily  broad spectrum sunscreen SPF 30+ to sun-exposed areas, reapply every 2 hours as needed.  - Recommend staying in the shade or wearing long sleeves, sun glasses (UVA+UVB protection) and wide brim hats (4-inch brim around the entire circumference of the hat). - Call for new or changing lesions.  SEBORRHEIC KERATOSIS - Stuck-on, waxy, tan-brown papules and/or plaques  - Benign-appearing - Discussed benign etiology and prognosis. - Observe - Call for any changes  LENTIGINES Exam: scattered tan macules Due to sun exposure Treatment Plan: Benign-appearing, observe. Recommend daily broad spectrum sunscreen SPF 30+ to sun-exposed areas, reapply every 2 hours as needed.  Call for any changes  MELANOCYTIC NEVI Exam: Tan-brown and/or pink-flesh-colored symmetric macules and papules  Treatment Plan: Benign appearing on exam today. Recommend observation. Call clinic for new or changing moles. Recommend daily use of broad spectrum spf 30+ sunscreen to sun-exposed areas.   HEMANGIOMA Exam: red papule(s) Discussed benign nature. Recommend observation. Call for changes.  Return in about 1 year (around 02/29/2024) for TBSE, with Dr. Beverlyn Roux, RMA, am acting as scribe for Armida Sans, MD .   Documentation: I have reviewed the above documentation for accuracy and completeness, and I agree with the above.  Armida Sans, MD

## 2023-03-10 ENCOUNTER — Encounter: Payer: Self-pay | Admitting: Dermatology

## 2023-09-24 ENCOUNTER — Other Ambulatory Visit (HOSPITAL_COMMUNITY)
Admission: RE | Admit: 2023-09-24 | Discharge: 2023-09-24 | Disposition: A | Discharge status: Home / Self Care | Source: Ambulatory Visit | Attending: Family Medicine | Admitting: Family Medicine

## 2023-09-24 ENCOUNTER — Other Ambulatory Visit: Payer: Self-pay | Admitting: Family Medicine

## 2023-09-24 DIAGNOSIS — Z1211 Encounter for screening for malignant neoplasm of colon: Secondary | ICD-10-CM | POA: Insufficient documentation

## 2023-09-27 LAB — CYTOLOGY - PAP
Comment: NEGATIVE
Diagnosis: NEGATIVE
High risk HPV: NEGATIVE

## 2023-11-23 ENCOUNTER — Ambulatory Visit: Admitting: Urology

## 2023-11-23 ENCOUNTER — Encounter: Payer: Self-pay | Admitting: Urology

## 2023-11-23 VITALS — BP 120/69 | HR 72 | Ht 65.0 in | Wt 128.0 lb

## 2023-11-23 DIAGNOSIS — N390 Urinary tract infection, site not specified: Secondary | ICD-10-CM

## 2023-11-23 LAB — URINALYSIS, COMPLETE
Bilirubin, UA: NEGATIVE
Glucose, UA: NEGATIVE
Ketones, UA: NEGATIVE
Leukocytes,UA: NEGATIVE
Nitrite, UA: NEGATIVE
Protein,UA: NEGATIVE
Specific Gravity, UA: 1.02 (ref 1.005–1.030)
Urobilinogen, Ur: 0.2 mg/dL (ref 0.2–1.0)
pH, UA: 6 (ref 5.0–7.5)

## 2023-11-23 LAB — MICROSCOPIC EXAMINATION: Bacteria, UA: NONE SEEN

## 2023-11-23 LAB — BLADDER SCAN AMB NON-IMAGING: Scan Result: 51

## 2023-11-23 MED ORDER — ESTRADIOL 0.1 MG/GM VA CREA: TOPICAL_CREAM | VAGINAL | 1 refills | Status: AC

## 2023-11-23 MED ORDER — NITROFURANTOIN MACROCRYSTAL 50 MG PO CAPS: 50.0000 mg | ORAL_CAPSULE | Freq: Every day | ORAL | 0 refills | Status: AC

## 2023-11-25 NOTE — Progress Notes (Signed)
 I, Haley Melendez, acting as a scribe for Haley JAYSON Barba, MD., have documented all relevant documentation on the behalf of Haley JAYSON Barba, MD, as directed by Haley JAYSON Barba, MD while in the presence of Haley JAYSON Barba, MD.  11/23/2023 2:08 PM   Haley Melendez 04/28/1965 [id number]  Referring provider: Kip Righter, MD [address] [place],  [place] [address]  Chief Complaint  Patient presents with   Recurrent UTI    HPI: Haley Melendez is a 58 y.o. female referred for evaluation of recurrent UTIs.   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 Typical symptoms of urinary frequency, urgency, and dysuria. She will typically be seen by one of the PAs with urinalysis and urine cultures. Symptoms resolved with antibiotic therapy She has had positive cultures for E. coli November 2024, 08/08/2023, 09/05/2023. he had positive cultures for Klebsiella on 05/09/23 and 10/21/23 the majority of these bacteria have been pan-sensitive.  In the past she has been on post-coital antibiotic suppression, however is presently not sexually active.  She does have some baseline overactive bladder symptoms including frequency, urgency with occasional urge incontinence, and stress incontinence.  Presently asymptomatic. She has taken D-mannose in the past which did not seem to decrease the frequency of her infections. She recently saw her PCP and was started on an over-the-counter vaginal gel for atrophic changes called Hyalo Gyn which does seem to help some with her vaginal dryness. No history of febrile UTIs or pyelonephritis.    PMH: Past Medical History:  Diagnosis Date   BCC (basal cell carcinoma) 05/2022   nose, Mohs in [place] by Dr. Krystal Pouch   Depression     Surgical History: Past Surgical History:  Procedure Laterality Date   BREAST LUMPECTOMY Left 2018   benign   BREAST LUMPECTOMY WITH RADIOACTIVE SEED LOCALIZATION  Left 06/16/2016   Procedure: LEFT BREAST LUMPECTOMY WITH RADIOACTIVE SEED LOCALIZATION;  Surgeon: Morene Olives, MD;  Location: [facility];  Service: General;  Laterality: Left;   DILATION AND CURETTAGE OF UTERUS     KNEE ARTHROSCOPY     WISDOM TOOTH EXTRACTION      Home Medications:  Allergies as of 11/23/2023       Reactions   Hydrocodone Itching        Medication List        Accurate as of November 23, 2023 11:59 PM. If you have any questions, ask your nurse or doctor.          amLODipine 5 MG tablet Commonly known as: NORVASC Take 5 mg by mouth daily.   citalopram 20 MG tablet Commonly known as: CELEXA Take 20 mg by mouth daily.   Co Q 10 100 MG Caps as directed Orally once a day   estradiol  0.1 MG/GM vaginal cream Commonly known as: ESTRACE  Apply a pea-sized amount to fingertip or Q-tip and wipe in vaginal roof twice weekly Started by: Haley Melendez   glucosamine-chondroitin 500-400 MG tablet Take 1 tablet by mouth 3 (three) times daily.   Melatonin 5 MG Caps Take 10 mg by mouth daily.   metFORMIN 500 MG tablet Commonly known as: GLUCOPHAGE Take 500 mg by mouth 2 (two) times daily.   nitrofurantoin  50 MG capsule Commonly known as: MACRODANTIN  Take 1 capsule (50 mg total) by mouth daily. Started by: Haley Melendez   rosuvastatin 5 MG tablet Commonly known as: CRESTOR Take 5 mg by mouth  daily.   traZODone 50 MG tablet Commonly known as: DESYREL Take 50 mg by mouth at bedtime.   WOMENS MULTI PO Take by mouth.        Allergies:  Allergies  Allergen Reactions   Hydrocodone Itching    Social History:  reports that she has never smoked. She has never used smokeless tobacco. She reports that she does not drink alcohol and does not use drugs.   Physical Exam: BP 120/69   Pulse 72   Ht 5' 5 (1.651 m)   Wt 128 lb (58.1 kg)   BMI 21.30 kg/m   Constitutional:  Alert and oriented, No acute distress. HEENT: [place]  AT Respiratory: Normal respiratory effort, no increased work of breathing. Psychiatric: Normal mood and affect.   Urinalysis Dipstick trace blood/microscopy negative    Assessment & Plan:    1. Recurrent UTI Meets criteria for recurrent UTIs in women by AUA guidelines based on culture documentation. Discussed and initiated low-dose vaginal estrogen (Estrace ) to replenish vaginal flora and decrease incidence of UTIs. Prescription for Estrace  to be used twice weekly sent to pharmacy. Advised that vaginal estrogen may take 2-3 months to achieve efficacy. Due to the frequency of infections, initiated low-dose Nitrofurantoin  50 mg daily.  I have reviewed the above documentation for accuracy and completeness, and I agree with the above.   Haley JAYSON Barba, MD  [facility] [address], [address] [place], [place] [address] [phone]

## 2024-03-05 ENCOUNTER — Ambulatory Visit: Payer: Commercial Managed Care - PPO | Admitting: Dermatology

## 2024-03-05 ENCOUNTER — Encounter: Payer: Self-pay | Admitting: Dermatology

## 2024-03-05 DIAGNOSIS — Z79899 Other long term (current) drug therapy: Secondary | ICD-10-CM

## 2024-03-05 DIAGNOSIS — Z7189 Other specified counseling: Secondary | ICD-10-CM

## 2024-03-05 DIAGNOSIS — L821 Other seborrheic keratosis: Secondary | ICD-10-CM

## 2024-03-05 DIAGNOSIS — W908XXA Exposure to other nonionizing radiation, initial encounter: Secondary | ICD-10-CM

## 2024-03-05 DIAGNOSIS — D229 Melanocytic nevi, unspecified: Secondary | ICD-10-CM

## 2024-03-05 DIAGNOSIS — Z85828 Personal history of other malignant neoplasm of skin: Secondary | ICD-10-CM

## 2024-03-05 DIAGNOSIS — L570 Actinic keratosis: Secondary | ICD-10-CM

## 2024-03-05 DIAGNOSIS — L578 Other skin changes due to chronic exposure to nonionizing radiation: Secondary | ICD-10-CM | POA: Diagnosis not present

## 2024-03-05 DIAGNOSIS — L814 Other melanin hyperpigmentation: Secondary | ICD-10-CM | POA: Diagnosis not present

## 2024-03-05 DIAGNOSIS — Z5111 Encounter for antineoplastic chemotherapy: Secondary | ICD-10-CM

## 2024-03-05 DIAGNOSIS — D1801 Hemangioma of skin and subcutaneous tissue: Secondary | ICD-10-CM

## 2024-03-05 DIAGNOSIS — Z1283 Encounter for screening for malignant neoplasm of skin: Secondary | ICD-10-CM

## 2024-03-05 MED ORDER — FLUOROURACIL 5 % EX CREA: TOPICAL_CREAM | Freq: Two times a day (BID) | CUTANEOUS | 0 refills | Status: AC

## 2024-03-05 NOTE — Patient Instructions (Addendum)
 Your prescription was sent to [facility] in [place]. A representative from [facility] will contact you within 3 business hours to verify your address and insurance information to schedule a free delivery. If for any reason you do not receive a phone call from them, please reach out to them. Their phone number is [phone] and their hours are Monday-Friday 9:00 am-5:00 pm.   5-Fluorouracil/Calcipotriene Patient Education - apply twice daily to the right nose for two weeks.  Actinic keratoses are the dry, red scaly spots on the skin caused by sun damage. A portion of these spots can turn into skin cancer with time, and treating them can help prevent development of skin cancer.   Treatment of these spots requires removal of the defective skin cells. There are various ways to remove actinic keratoses, including freezing with liquid nitrogen, treatment with creams, or treatment with a blue light procedure in the office.   5-fluorouracil cream is a topical cream used to treat actinic keratoses. It works by interfering with the growth of abnormal fast-growing skin cells, such as actinic keratoses. These cells peel off and are replaced by healthy ones. THIS CREAM SHOULD BE KEPT OUT OF REACH OF CHILDREN AND PETS AND SHOULD NOT BE USED BY PREGNANT WOMEN.  5-fluorouracil/calcipotriene is a combination of the 5-fluorouracil cream with a vitamin D analog cream called calcipotriene. The calcipotriene alone does not treat actinic keratoses. However, when it is combined with 5-fluorouracil, it helps the 5-fluorouracil treat the actinic keratoses much faster so that the same results can be achieved with a much shorter treatment time.  INSTRUCTIONS FOR 5-FLUOROURACIL/CALCIPOTRIENE CREAM:   5-fluorouracil/calcipotriene cream typically only needs to be used for 4-7 days. A thin layer should be applied twice a day to the treatment areas recommended by your physician.   If your physician prescribed you  separate tubes of 5-fluourouracil and calcipotriene, apply a thin layer of 5-fluorouracil followed by a thin layer of calcipotriene.   Avoid contact with your eyes or nostrils. Avoid applying the cream to your eyelids or lips unless directed to apply there by your physician. Do not use 5-fluorouracil/calcipotriene cream on infected or open wounds.   You will develop redness, irritation and some crusting at areas where you have pre-cancer damage/actinic keratoses. IF YOU DEVELOP PAIN, BLEEDING, OR SIGNIFICANT CRUSTING, STOP THE TREATMENT EARLY - you have already gotten a good response and the actinic keratoses should clear up well.  Wash your hands after applying 5-fluorouracil 5% cream on your skin.   A moisturizer or sunscreen with a minimum SPF 30 should be applied each morning.   Once you have finished the treatment, you can apply a thin layer of Vaseline twice a day to irritated areas to soothe and calm the areas more quickly. If you experience significant discomfort, contact your physician.  For some patients it is necessary to repeat the treatment for best results.  SIDE EFFECTS: When using 5-fluorouracil/calcipotriene cream, you may have mild irritation, such as redness, dryness, swelling, or a mild burning sensation. This usually resolves within 2 weeks. The more actinic keratoses you have, the more redness and inflammation you can expect during treatment. Eye irritation has been reported rarely. If this occurs, please let us  know.   If you have any trouble using this cream, please send us  a MyChart message or call the office. If you have any other questions about this information, please do not hesitate to ask me before you leave the office or contact me on MyChart or  by phone.    Due to recent changes in healthcare laws, you may see results of your pathology and/or laboratory studies on MyChart before the doctors have had a chance to review them. We understand that in some cases there  may be results that are confusing or concerning to you. Please understand that not all results are received at the same time and often the doctors may need to interpret multiple results in order to provide you with the best plan of care or course of treatment. Therefore, we ask that you please give us  2 business days to thoroughly review all your results before contacting the office for clarification. Should we see a critical lab result, you will be contacted sooner.   If You Need Anything After Your Visit  If you have any questions or concerns for your doctor, please call our main line at [phone] and press option 4 to reach your doctor's medical assistant. If no one answers, please leave a voicemail as directed and we will return your call as soon as possible. Messages left after 4 pm will be answered the following business day.   You may also send us  a message via MyChart. We typically respond to MyChart messages within 1-2 business days.  For prescription refills, please ask your pharmacy to contact our office. Our fax number is [phone].  If you have an urgent issue when the clinic is closed that cannot wait until the next business day, you can page your doctor at the number below.    Please note that while we do our best to be available for urgent issues outside of office hours, we are not available 24/7.   If you have an urgent issue and are unable to reach us , you may choose to seek medical care at your doctor's office, retail clinic, urgent care center, or emergency room.  If you have a medical emergency, please immediately call 911 or go to the emergency department.  Pager Numbers  - Dr. Hester: [phone]  - Dr. Jackquline: [phone]  - Dr. Claudene: [phone]   - Dr. Raymund: [phone]  In the event of inclement weather, please call our main line at [phone] for an update on the status of any delays or closures.  Dermatology Medication Tips: Please keep  the boxes that topical medications come in in order to help keep track of the instructions about where and how to use these. Pharmacies typically print the medication instructions only on the boxes and not directly on the medication tubes.   If your medication is too expensive, please contact our office at [phone] option 4 or send us  a message through MyChart.   We are unable to tell what your co-pay for medications will be in advance as this is different depending on your insurance coverage. However, we may be able to find a substitute medication at lower cost or fill out paperwork to get insurance to cover a needed medication.   If a prior authorization is required to get your medication covered by your insurance company, please allow us  1-2 business days to complete this process.  Drug prices often vary depending on where the prescription is filled and some pharmacies may offer cheaper prices.  The website www.goodrx.com contains coupons for medications through different pharmacies. The prices here do not account for what the cost may be with help from insurance (it may be cheaper with your insurance), but the website can give you the price if you did not use any insurance.  -  You can print the associated coupon and take it with your prescription to the pharmacy.  - You may also stop by our office during regular business hours and pick up a GoodRx coupon card.  - If you need your prescription sent electronically to a different pharmacy, notify our office through [facility] or by phone at [phone] option 4.     Si Usted Necesita Algo Despus de Su Visita  Tambin puede enviarnos un mensaje a travs de Clinical Cytogeneticist. Por lo general respondemos a los mensajes de MyChart en el transcurso de 1 a 2 das hbiles.  Para renovar recetas, por favor pida a su farmacia que se ponga en contacto con nuestra oficina. [place] de fax es [place] [phone].  Si tiene un asunto urgente cuando  la clnica est cerrada y que no puede esperar hasta el siguiente da hbil, puede llamar/localizar a su doctor(a) al nmero que aparece a continuacin.   Por favor, tenga en cuenta que aunque hacemos todo lo posible para estar disponibles para asuntos urgentes fuera del horario de [place], no estamos disponibles las 24 horas del da, los 7 [address]  [address] de la [place].   Si tiene un problema urgente y no puede comunicarse con nosotros, puede optar por buscar atencin mdica  en el consultorio de su doctor(a), en una clnica privada, en un centro de atencin urgente o en una sala de emergencias.  Si tiene engineer, drilling, por favor llame inmediatamente al 911 o vaya a la sala de emergencias.  Nmeros de bper  - Dr. Hester: [phone]  - Dra. Jackquline: [phone]  - Dr. Claudene: [phone]  - Dra. Kitts: [phone]  En caso de inclemencias del [place], por favor llame a nuestra lnea principal al [phone] para una actualizacin sobre el estado de cualquier retraso o cierre.  Consejos para la medicacin en dermatologa: Por favor, guarde las cajas en las que vienen los medicamentos de uso tpico para ayudarle a seguir las instrucciones sobre dnde y cmo usarlos. Las farmacias generalmente imprimen las instrucciones del medicamento slo en las cajas y no directamente en los tubos del [place].   Si su medicamento es muy caro, por favor, pngase en contacto con landry rieger llamando al [phone] y presione la opcin 4 o envenos un mensaje a travs de Clinical Cytogeneticist.   No podemos decirle cul ser su copago por los medicamentos por adelantado ya que esto es diferente dependiendo de la cobertura de su seguro. Sin embargo, es posible que podamos encontrar un medicamento sustituto a audiological scientist un formulario para que el seguro cubra el medicamento que se considera necesario.   Si se requiere una autorizacin previa para que su compaa de seguros cubra su medicamento, por favor  permtanos de 1 a 2 das hbiles para completar este proceso.  Los precios de los medicamentos varan con frecuencia dependiendo del environmental consultant de dnde se surte la receta y alguna farmacias pueden ofrecer precios ms baratos.  El sitio web www.goodrx.com tiene cupones para medicamentos de health and safety inspector. Los precios aqu no tienen en cuenta lo que podra costar con la ayuda del seguro (puede ser ms barato con su seguro), pero el sitio web puede darle el precio si no utiliz tourist information centre manager.  - Puede imprimir el cupn correspondiente y llevarlo con su receta a la farmacia.  - Tambin puede pasar por nuestra oficina durante el horario de atencin regular y education officer, museum una tarjeta de cupones de GoodRx.  - Si necesita que su receta se enve electrnicamente  a [place], informe a nuestra oficina a travs de MyChart de [place] o por telfono llamando al [phone] y presione la opcin 4.

## 2024-03-05 NOTE — Progress Notes (Signed)
 Follow-Up Visit   Subjective  Haley Melendez is a 58 y.o. female who presents for the following: Skin Cancer Screening and Full Body Skin Exam  The patient presents for Total-Body Skin Exam (TBSE) for skin cancer screening and mole check. The patient has spots, moles and lesions to be evaluated, some may be new or changing and the patient may have concern these could be cancer.  The following portions of the chart were reviewed this encounter and updated as appropriate: medications, allergies, medical history  Review of Systems:  No other skin or systemic complaints except as noted in HPI or Assessment and Plan.  Objective  Well appearing patient in no apparent distress; mood and affect are within normal limits.  A full examination was performed including scalp, head, eyes, ears, nose, lips, neck, chest, axillae, abdomen, back, buttocks, bilateral upper extremities, bilateral lower extremities, hands, feet, fingers, toes, fingernails, and toenails. All findings within normal limits unless otherwise noted below.   Relevant physical exam findings are noted in the Assessment and Plan. R nose x 1 Erythematous thin papules/macules with gritty scale.    Assessment & Plan   SKIN CANCER SCREENING PERFORMED TODAY.  ACTINIC DAMAGE - Chronic condition, secondary to cumulative UV/sun exposure - diffuse scaly erythematous macules with underlying dyspigmentation - Recommend daily broad spectrum sunscreen SPF 30+ to sun-exposed areas, reapply every 2 hours as needed.  - Staying in the shade or wearing long sleeves, sun glasses (UVA+UVB protection) and wide brim hats (4-inch brim around the entire circumference of the hat) are also recommended for sun protection.  - Call for new or changing lesions.  LENTIGINES, SEBORRHEIC KERATOSES, HEMANGIOMAS - Benign normal skin lesions - Benign-appearing - Call for any changes  MELANOCYTIC NEVI - Tan-brown and/or pink-flesh-colored symmetric macules and  papules - Benign appearing on exam today - Observation - Call clinic for new or changing moles - Recommend daily use of broad spectrum spf 30+ sunscreen to sun-exposed areas.   HISTORY OF BASAL CELL CARCINOMA OF THE SKIN - No evidence of recurrence today - Recommend regular full body skin exams - Recommend daily broad spectrum sunscreen SPF 30+ to sun-exposed areas, reapply every 2 hours as needed.  - Call if any new or changing lesions are noted between office visits  AK (ACTINIC KERATOSIS) R nose x 1 Actinic keratoses are precancerous spots that appear secondary to cumulative UV radiation exposure/sun exposure over time. They are chronic with expected duration over 1 year. A portion of actinic keratoses will progress to squamous cell carcinoma of the skin. It is not possible to reliably predict which spots will progress to skin cancer and so treatment is recommended to prevent development of skin cancer.  Recommend daily broad spectrum sunscreen SPF 30+ to sun-exposed areas, reapply every 2 hours as needed.  Recommend staying in the shade or wearing long sleeves, sun glasses (UVA+UVB protection) and wide brim hats (4-inch brim around the entire circumference of the hat). Call for new or changing lesions.  - On Jan 2nd start 5FU/Calcipotriene cream BID x 2 weeks. Pt to take a photo at the end of treatment and send it to our office via MyChart.  Destruction of lesion - R nose x 1 Complexity: simple   Destruction method: cryotherapy   Informed consent: discussed and consent obtained   Timeout:  patient name, date of birth, surgical site, and procedure verified Lesion destroyed using liquid nitrogen: Yes   Region frozen until ice ball extended beyond lesion: Yes  Outcome: patient tolerated procedure well with no complications   Post-procedure details: wound care instructions given     Return in about 3 months (around 06/05/2024) for AK recheck s/p 5FU/Calcipotriene mix.  [name] Haley Melendez, CMA, am acting as scribe for Alm Rhyme, MD .   Documentation: I have reviewed the above documentation for accuracy and completeness, and I agree with the above.  Alm Rhyme, MD

## 2024-06-04 ENCOUNTER — Ambulatory Visit: Admitting: Dermatology
